# Patient Record
Sex: Female | Born: 1975 | Race: Black or African American | Hispanic: No | Marital: Single | Smoking: Never smoker
Health system: Southern US, Community
[De-identification: ages and names within clinical notes are randomized; demographics above are authoritative.]

## PROBLEM LIST (undated history)

## (undated) DIAGNOSIS — E282 Polycystic ovarian syndrome: Secondary | ICD-10-CM

## (undated) DIAGNOSIS — K219 Gastro-esophageal reflux disease without esophagitis: Secondary | ICD-10-CM

## (undated) DIAGNOSIS — M797 Fibromyalgia: Secondary | ICD-10-CM

## (undated) DIAGNOSIS — E669 Obesity, unspecified: Secondary | ICD-10-CM

## (undated) DIAGNOSIS — S060X9A Concussion with loss of consciousness of unspecified duration, initial encounter: Secondary | ICD-10-CM

## (undated) DIAGNOSIS — S060XAA Concussion with loss of consciousness status unknown, initial encounter: Secondary | ICD-10-CM

## (undated) DIAGNOSIS — I10 Essential (primary) hypertension: Secondary | ICD-10-CM

## (undated) DIAGNOSIS — I272 Pulmonary hypertension, unspecified: Secondary | ICD-10-CM

## (undated) DIAGNOSIS — E785 Hyperlipidemia, unspecified: Secondary | ICD-10-CM

## (undated) DIAGNOSIS — T7840XA Allergy, unspecified, initial encounter: Secondary | ICD-10-CM

## (undated) DIAGNOSIS — S0990XA Unspecified injury of head, initial encounter: Secondary | ICD-10-CM

## (undated) DIAGNOSIS — M1990 Unspecified osteoarthritis, unspecified site: Secondary | ICD-10-CM

## (undated) DIAGNOSIS — M069 Rheumatoid arthritis, unspecified: Secondary | ICD-10-CM

## (undated) HISTORY — DX: Essential (primary) hypertension: I10

## (undated) HISTORY — DX: Rheumatoid arthritis, unspecified: M06.9

## (undated) HISTORY — DX: Pulmonary hypertension, unspecified: I27.20

## (undated) HISTORY — DX: Gastro-esophageal reflux disease without esophagitis: K21.9

## (undated) HISTORY — DX: Unspecified osteoarthritis, unspecified site: M19.90

## (undated) HISTORY — DX: Allergy, unspecified, initial encounter: T78.40XA

## (undated) HISTORY — DX: Hyperlipidemia, unspecified: E78.5

## (undated) HISTORY — PX: NO PAST SURGERIES: SHX2092

---

## 2000-10-18 DIAGNOSIS — S0990XA Unspecified injury of head, initial encounter: Secondary | ICD-10-CM

## 2000-10-18 HISTORY — DX: Unspecified injury of head, initial encounter: S09.90XA

## 2010-09-17 ENCOUNTER — Ambulatory Visit: Payer: Self-pay | Admitting: Family Medicine

## 2010-09-17 DIAGNOSIS — E282 Polycystic ovarian syndrome: Secondary | ICD-10-CM

## 2010-09-17 DIAGNOSIS — G4730 Sleep apnea, unspecified: Secondary | ICD-10-CM | POA: Insufficient documentation

## 2010-09-17 DIAGNOSIS — R21 Rash and other nonspecific skin eruption: Secondary | ICD-10-CM | POA: Insufficient documentation

## 2010-09-17 DIAGNOSIS — J45909 Unspecified asthma, uncomplicated: Secondary | ICD-10-CM | POA: Insufficient documentation

## 2010-09-17 DIAGNOSIS — S0990XA Unspecified injury of head, initial encounter: Secondary | ICD-10-CM | POA: Insufficient documentation

## 2010-09-17 DIAGNOSIS — E119 Type 2 diabetes mellitus without complications: Secondary | ICD-10-CM | POA: Insufficient documentation

## 2010-09-17 DIAGNOSIS — I10 Essential (primary) hypertension: Secondary | ICD-10-CM

## 2010-09-17 DIAGNOSIS — K219 Gastro-esophageal reflux disease without esophagitis: Secondary | ICD-10-CM

## 2010-09-17 DIAGNOSIS — M109 Gout, unspecified: Secondary | ICD-10-CM | POA: Insufficient documentation

## 2010-09-17 DIAGNOSIS — J309 Allergic rhinitis, unspecified: Secondary | ICD-10-CM | POA: Insufficient documentation

## 2010-09-17 DIAGNOSIS — E785 Hyperlipidemia, unspecified: Secondary | ICD-10-CM

## 2010-09-17 DIAGNOSIS — M129 Arthropathy, unspecified: Secondary | ICD-10-CM | POA: Insufficient documentation

## 2010-09-17 DIAGNOSIS — I2789 Other specified pulmonary heart diseases: Secondary | ICD-10-CM

## 2010-09-21 ENCOUNTER — Telehealth (INDEPENDENT_AMBULATORY_CARE_PROVIDER_SITE_OTHER): Payer: Self-pay | Admitting: *Deleted

## 2010-09-21 LAB — CONVERTED CEMR LAB
ALT: 19 units/L (ref 0–35)
Albumin: 3.7 g/dL (ref 3.5–5.2)
Basophils Relative: 0.4 % (ref 0.0–3.0)
Cholesterol: 231 mg/dL — ABNORMAL HIGH (ref 0–200)
Direct LDL: 164.9 mg/dL
Eosinophils Relative: 0.5 % (ref 0.0–5.0)
GFR calc non Af Amer: 109.98 mL/min (ref >60)
HDL: 52 mg/dL (ref >39.00)
Hgb A1c MFr Bld: 6.3 % (ref 4.6–6.5)
Lymphocytes Relative: 28.7 % (ref 12.0–46.0)
Neutrophils Relative %: 66.5 % (ref 43.0–77.0)
Platelets: 270 10*3/uL (ref 150.0–400.0)
Potassium: 3.9 meq/L (ref 3.5–5.1)
RBC: 4.12 M/uL (ref 3.87–5.11)
Sodium: 136 meq/L (ref 135–145)
TSH: 1.52 microintl units/mL (ref 0.35–5.50)
Total Bilirubin: 0.5 mg/dL (ref 0.3–1.2)
Total Protein: 7 g/dL (ref 6.0–8.3)
Triglycerides: 65 mg/dL (ref 0.0–149.0)
WBC: 15.8 10*3/uL — ABNORMAL HIGH (ref 4.5–10.5)

## 2010-09-23 ENCOUNTER — Telehealth (INDEPENDENT_AMBULATORY_CARE_PROVIDER_SITE_OTHER): Payer: Self-pay | Admitting: *Deleted

## 2010-09-23 ENCOUNTER — Ambulatory Visit: Payer: Self-pay | Admitting: Family Medicine

## 2010-09-23 DIAGNOSIS — D72829 Elevated white blood cell count, unspecified: Secondary | ICD-10-CM | POA: Insufficient documentation

## 2010-09-23 LAB — CONVERTED CEMR LAB
Basophils Relative: 0.4 % (ref 0.0–3.0)
Eosinophils Relative: 0.5 % (ref 0.0–5.0)
HCT: 37.1 % (ref 36.0–46.0)
MCV: 90.2 fL (ref 78.0–100.0)
Monocytes Absolute: 0.7 10*3/uL (ref 0.1–1.0)
Monocytes Relative: 4.5 % (ref 3.0–12.0)
Neutrophils Relative %: 59.1 % (ref 43.0–77.0)
RBC: 4.11 M/uL (ref 3.87–5.11)
WBC: 16.7 10*3/uL — ABNORMAL HIGH (ref 4.5–10.5)

## 2010-09-25 ENCOUNTER — Ambulatory Visit: Payer: Self-pay | Admitting: Hematology & Oncology

## 2010-09-30 ENCOUNTER — Telehealth: Payer: Self-pay | Admitting: Family Medicine

## 2010-10-09 ENCOUNTER — Encounter: Payer: Self-pay | Admitting: Family Medicine

## 2010-10-09 LAB — CBC WITH DIFFERENTIAL (CANCER CENTER ONLY)
BASO#: 0.1 10*3/uL (ref 0.0–0.2)
Eosinophils Absolute: 0.2 10*3/uL (ref 0.0–0.5)
HCT: 37 % (ref 34.8–46.6)
LYMPH%: 25.2 % (ref 14.0–48.0)
MCH: 29.4 pg (ref 26.0–34.0)
MCV: 87 fL (ref 81–101)
MONO#: 0.5 10*3/uL (ref 0.1–0.9)
MONO%: 4.6 % (ref 0.0–13.0)
NEUT%: 68.2 % (ref 39.6–80.0)
Platelets: 281 10*3/uL (ref 145–400)
RBC: 4.24 10*6/uL (ref 3.70–5.32)
WBC: 11.3 10*3/uL — ABNORMAL HIGH (ref 3.9–10.0)

## 2010-10-09 LAB — CHCC SATELLITE - SMEAR

## 2010-10-27 ENCOUNTER — Encounter: Payer: Self-pay | Admitting: Family Medicine

## 2010-11-10 ENCOUNTER — Ambulatory Visit
Admission: RE | Admit: 2010-11-10 | Discharge: 2010-11-10 | Payer: Self-pay | Source: Home / Self Care | Attending: Pulmonary Disease | Admitting: Pulmonary Disease

## 2010-11-17 ENCOUNTER — Telehealth: Payer: Self-pay | Admitting: Pulmonary Disease

## 2010-11-17 NOTE — Progress Notes (Signed)
Summary: labs   Phone Note Outgoing Call   Call placed by: Doristine Devoid CMA,  September 21, 2010 4:59 PM Call placed to: Patient Summary of Call: total cholesterol and LDL are both elevated.  given that pt has DM her goal is <70 and only Crestor will get her the reduction she needs.  should start Crestor 40 mg at bedtime and recheck LFTs in 6-8 weeks.  A1C of 6.3 also confirms diabetes.  should restart Metformin 500mg  two times a day.  this will also help w/ PCOS  WBC elevated.  should recheck CBC in 1 week.  dx leukocytosis   Follow-up for Phone Call        left message on machine .....Marland KitchenMarland KitchenDoristine Devoid CMA  September 21, 2010 4:59 PM   Additional Follow-up for Phone Call Additional follow up Details #1::        Patient is aware, rx's need to be sent to [facility] [address] in [facility]. Lab appt made for tomorrow.  Additional Follow-up by: Harold Barban,  September 22, 2010 10:26 AM    New/Updated Medications: METFORMIN HCL 500 MG TABS (METFORMIN HCL) take one tablet two times a day CRESTOR 40 MG TABS (ROSUVASTATIN CALCIUM) take one tablet at bedtime Prescriptions: CRESTOR 40 MG TABS (ROSUVASTATIN CALCIUM) take one tablet at bedtime  #30 x 3   Entered by:   Doristine Devoid CMA   Authorized by:   Neena Rhymes MD   Signed by:   Doristine Devoid CMA on 09/22/2010   Method used:   Electronically to        [facility] # [phone]* (retail)       [address] [place], [place]  [address]       Ph: [phone]       Fax: [phone]   RxID:   [id number] METFORMIN HCL 500 MG TABS (METFORMIN HCL) take one tablet two times a day  #60 x 3   Entered by:   Doristine Devoid CMA   Authorized by:   Neena Rhymes MD   Signed by:   Doristine Devoid CMA on 09/22/2010   Method used:   Electronically to        [facility] # [phone]* (retail)       [address] [place], [place]  [address]       Ph: [phone]       Fax:  [phone]   RxID:   [id number]

## 2010-11-17 NOTE — Progress Notes (Signed)
Summary: labs-  Phone Note Outgoing Call   Call placed by: Doristine Devoid CMA,  September 23, 2010 4:51 PM Call placed to: Patient Summary of Call: WBC is higher than last time.  if pt is asymptomatic (no complaints of infxn) she needs a referral to hematology to determine the cause of her increased numbers   Follow-up for Phone Call        Spoke w/ patient says she has had problem w/ WBCs previous and saw hematologist about 2 years ago but would like to go ahead w/ referral. Also says she feels alittle ill but nothing that would indicated infection informed we will contact her with appt information........Marland KitchenDoristine Devoid CMA  September 23, 2010 4:52 PM   New Problems: LEUKOCYTOSIS UNSPECIFIED (ICD-288.60)   New Problems: LEUKOCYTOSIS UNSPECIFIED (ICD-288.60)

## 2010-11-19 NOTE — Consult Note (Signed)
Summary: [facility]  [facility]   Imported By: Lanelle Bal 11/12/2010 12:54:02  _____________________________________________________________________  External Attachment:    Type:   Image     Comment:   External Document

## 2010-11-19 NOTE — Consult Note (Signed)
Summary: [facility]  [facility]   Imported By: Lanelle Bal 11/13/2010 09:38:52  _____________________________________________________________________  External Attachment:    Type:   Image     Comment:   External Document

## 2010-11-19 NOTE — Progress Notes (Signed)
Summary: REFERRALS FOR PAIN  Phone Note Outgoing Call Call back at Home Phone [phone]   Call placed by: Magdalen Spatz [facility],  September 30, 2010 1:52 PM Call placed to: Patient Summary of Call: I CONTACTED PATIENT TO INFORM HER OF ALL HER REFERRAL APPTS.  PATIENT STATES SHE DIDN'T NEED THE ORTHOPAEDIC REFERRAL, SHE IS ALREADY SEEING [facility] HER ER VISIT & THEY DID NOTHING FOR HER PAIN.  PATIENT THOUGHT DR. TABORI WAS GOING TO REFER HER TO PHYSICAL THERAPY OR PAIN MGMT, OR BOTH??  PLEASE ADVISE. Initial call taken by: Magdalen Spatz [facility]  September 30, 2010 1:52 PM  Follow-up for Phone Call        she can start w/ PT and if no improvement in pain will refer to pain management.  was unaware that she was seeing ortho but have it written on my notes from her 1st visit that the referral was to ortho.  my mistake. Follow-up by: Neena Rhymes MD,  September 30, 2010 2:17 PM  Additional Follow-up for Phone Call Additional follow up Details #1::        I left message for patient to call me Magdalen Spatz [facility]  October 02, 2010 10:10 AM  I left another message for patient to call me. Magdalen Spatz [facility]  October 16, 2010 9:24 AM

## 2010-11-19 NOTE — Assessment & Plan Note (Signed)
Summary: NEW TO ESTAB/CBS   Vital Signs:  Patient profile:   35 year old female Height:      70.75 inches Weight:      397 pounds BMI:     55.96 Pulse rate:   80 / minute BP sitting:   130 / 78  (left arm)  Vitals Entered By: Doristine Devoid CMA (September 17, 2010 10:09 AM) CC: NEW EST- NEEDS LABS AND DISCUSS MEDS    History of Present Illness: 35 yo woman here today to establish care.  recently moved from [place], [place].  Previous MD- Abbass.  1) Diabetes- reports she has been 'borderline' for a long time.  trying to control this w/ diet and exercise.  was given script for Glucophage but did not start this.  was having eye exams regularly, needs referral.  2) s/p head injury- 2002, has chronic pain in head, back, neck.  has trigeminal neuralgia.  was seeing a pain specialist in [place]- but hasn't seen since 2008.  went to ER last week after re-injuring head and was told she had a neck fx.  was referred to Dr Kendra Opitz (neurosurg), 12/7.  is wearing neck brace.  was started on Prednisone, Vicodin, Flexeril.  3) HTN- again not on meds.  tries to control w/ limited salt.  previously on Norvasc, HCTZ (flared gout).  4) Pulmonary HTN- dx'd in 2005, was previously seeing Cards and pulm.  on recheck pt reports 'pressures in the heart were normal'.  5) Gout- previously took Allopurinol w/ good results.  6) sleep apnea- not wearing CPAP mask.  needs pulm referral  7) GERD- previously on Prilosec and/or Nexium.  having sxs now that she's not on meds.  8) hyperlipidemia- doctor attempted to start meds and pt had difficulty w/ side effects- abd pain, nausea, dizziness.  trying to manage w/ diet and exercise.  9) arthritis- has R knee pain, back pain, DDD in neck/back.  10) PCOS- mentions this in same sentence as primary amenorrhea.  will need to establish w/ GYN  11) rash- reports she will have areas on skin that will periodically flare and then improve.  wants to see derm.  Preventive  Screening-Counseling & Management  Alcohol-Tobacco     Smoking Status: never  Caffeine-Diet-Exercise     Does Patient Exercise: no  Current Medications (verified): 1)  Vicodin .... Dose and Frequency Unknown 2)  Flexeril .... Dose and Frequency Unknown 3)  Prednisone .... Dose and Frequency Unknown  Allergies (verified): No Known Drug Allergies  Past History:  Past Medical History: Asthma Arthritis Diabetes mellitus, type II GERD Gout Pulmonary Hypertension  Neck Fracture Allergic rhinitis  Past Surgical History: none  Family History: CAD-no HTN-mother DM-grandmother,brother STROKE-no COLON CA-no BREAST CA-no  Social History: disabled due to head injurySmoking Status:  never Does Patient Exercise:  no  Review of Systems      See HPI  Physical Exam  General:  morbidly obese Head:  NCAT Eyes:  PERRL, EOMI Neck:  No deformities, masses, or tenderness noted. Lungs:  Normal respiratory effort, chest expands symmetrically. Lungs are clear to auscultation, no crackles or wheezes. Heart:  reg S1/S2 Abdomen:  obese, soft, NT/ND, +BS Pulses:  + 1 DP/PT Extremities:  difficult to distinguish edema from large legs Neurologic:  alert & oriented X3 and cranial nerves II-XII intact.  walks w/ cane  Skin:  turgor normal and color normal.   Cervical Nodes:  No lymphadenopathy noted Psych:  Oriented X3, normally interactive, and good eye  contact.     Impression & Recommendations:  Problem # 1:  DIABETES MELLITUS, TYPE II (ICD-250.00) Assessment New not currently on meds.  unclear why she never started metformin which would also help PCOS.  check labs and adjust meds as needed.  will refer for eye exam Orders: Specimen Handling ([address]109) TLB-BMP (Basic Metabolic Panel-BMET) (80048-METABOL) TLB-TSH (Thyroid Stimulating Hormone) (84443-TSH) TLB-A1C / Hgb A1C (Glycohemoglobin) (83036-A1C) Ophthalmology Referral (Ophthalmology)  Her updated medication list for this  problem includes:    Metformin Hcl 500 Mg Tabs (Metformin hcl) .Marland Kitchen... Take one tablet two times a day  Problem # 2:  HEAD INJURY, UNSPECIFIED (ICD-959.01) Assessment: New unclear as to what type of injury pt had or residual effects.  apparently this was enough to qualify her for disability.  takes pain meds regularly.  Problem # 3:  ESSENTIAL HYPERTENSION, BENIGN (ICD-401.1) Assessment: New BP adequately controlled today.  will need close monitoring.  discussed weight loss, healthy diet and regular exercise- all will improve pt's overall health and BP. Orders: Specimen Handling ([address]) TLB-CBC Platelet - w/Differential (85025-CBCD)  Problem # 4:  PULMONARY HYPERTENSION (ICD-416.8) Assessment: New  per pt report.  not currently on any meds.  will refer to pulm.  Orders: Pulmonary Referral (Pulmonary)  Problem # 5:  GOUT (ICD-274.9) Assessment: New pt w/ hx of gout.  previously on allopurinol daily.  will restart this to prevent flares- pt asymptomatic today. Her updated medication list for this problem includes:    Allopurinol 100 Mg Tabs (Allopurinol) .Marland Kitchen... 1 by mouth daily  Problem # 6:  SLEEP APNEA (ICD-780.57) Assessment: New  not wearing CPAP like she is supposed to.  will refer to pulm.  Orders: Pulmonary Referral (Pulmonary)  Problem # 7:  GERD (ICD-530.81) Assessment: New having sxs now that she's not on meds.  start omeprazole as it is generic.  reviewed dietary and lifestyle modifications. Her updated medication list for this problem includes:    Omeprazole 20 Mg Cpdr (Omeprazole) ..... One by mouth daily  Problem # 8:  HYPERLIPIDEMIA (ICD-272.4) Assessment: New not on meds despite dx.  check labs and start meds as needed. Orders: Venipuncture (09811) Specimen Handling (91478) TLB-Lipid Panel (80061-LIPID) TLB-Hepatic/Liver Function Pnl (80076-HEPATIC)  Her updated medication list for this problem includes:    Crestor 40 Mg Tabs (Rosuvastatin calcium) .Marland Kitchen...  Take one tablet at bedtime  Problem # 9:  ARTHRITIS (ICD-716.90) Assessment: New  this is very limiting for pt.  biggest complaint.  refer to ortho.  Orders: Orthopedic Referral (Ortho)  Problem # 10:  POLYCYSTIC OVARIES (ICD-256.4) Assessment: New  eventually refer to GYN for management.  Orders: Gynecologic Referral (Gyn)  Problem # 11:  RASH-NONVESICULAR (ICD-782.1) Assessment: New  start steroid cream as needed for flares.  refer to derm. Her updated medication list for this problem includes:    Hydrocortisone 2.5 % Oint (Hydrocortisone) .Marland Kitchen... Apply to affected area two times a day as needed.  disp 1 large tube  Orders: Dermatology Referral [facility]) Prescription Created Electronically ([id number])  Complete Medication List: 1)  Vicodin  .... Dose and frequency unknown 2)  Flexeril  .... Dose and frequency unknown 3)  Prednisone  .... Dose and frequency unknown 4)  Allopurinol 100 Mg Tabs (Allopurinol) .Marland Kitchen.. 1 by mouth daily 5)  Omeprazole 20 Mg Cpdr (Omeprazole) .... One by mouth daily 6)  Hydrocortisone 2.5 % Oint (Hydrocortisone) .... Apply to affected area two times a day as needed.  disp 1 large tube 7)  Single [employer]  .Marland KitchenMarland KitchenMarland Kitchen  1 heavy duty single point cane to assist w/ ambulation.  dx- arthritis 8)  Metformin Hcl 500 Mg Tabs (Metformin hcl) .... Take one tablet two times a day 9)  Crestor 40 Mg Tabs (Rosuvastatin calcium) .... Take one tablet at bedtime  Patient Instructions: 1)  Once we review your labs we'll determine when you need to follow up 2)  Someone will call you with your Pulmonary and Ortho appts.  We'll eventually get you to GYN and Derm as well. 3)  Start the Allopurinol daily for gout 4)  Take the Omeprazole daily for reflux 5)  Use the Hydrocortisone on your L forearm 6)  Please have your neurosurgeon send me his report 7)  Call with any questions or concerns 8)  Welcome!  We're glad to have you! Prescriptions: SINGLE POINT CANE 1 heavy duty single  point cane to assist w/ ambulation.  dx- arthritis  #1 x 0   Entered and Authorized by:   Neena Rhymes MD   Signed by:   Neena Rhymes MD on 09/17/2010   Method used:   Print then Give to Patient   RxID:   [id number] HYDROCORTISONE 2.5 % OINT (HYDROCORTISONE) apply to affected area two times a day as needed.  disp 1 large tube  #1 tube x 3   Entered and Authorized by:   Neena Rhymes MD   Signed by:   Neena Rhymes MD on 09/17/2010   Method used:   Electronically to        [facility] # [phone]* (retail)       [address] [place], [place]  [address]       Ph: [phone]       Fax: [phone]   RxID:   [id number] OMEPRAZOLE 20 MG CPDR (OMEPRAZOLE) one by mouth daily  #30 x 3   Entered and Authorized by:   Neena Rhymes MD   Signed by:   Neena Rhymes MD on 09/17/2010   Method used:   Electronically to        [facility] # [id number]* (retail)       [address] [place], [place]  [address]       Ph: [phone]       Fax: [phone]   RxID:   [id number] ALLOPURINOL 100 MG TABS (ALLOPURINOL) 1 by mouth daily  #30 x 3   Entered and Authorized by:   Neena Rhymes MD   Signed by:   Neena Rhymes MD on 09/17/2010   Method used:   Electronically to        [facility] # [phone]* (retail)       [address] [place], [place]  [address]       Ph: [phone]       Fax: [phone]   RxID:   [id number]    Orders Added: 1)  Venipuncture [18299] 2)  Specimen Handling [99000] 3)  TLB-Lipid Panel [80061-LIPID] 4)  TLB-Hepatic/Liver Function Pnl [80076-HEPATIC] 5)  TLB-BMP (Basic Metabolic Panel-BMET) [80048-METABOL] 6)  TLB-TSH (Thyroid Stimulating Hormone) [84443-TSH] 7)  TLB-A1C / Hgb A1C (Glycohemoglobin) [83036-A1C] 8)  TLB-CBC Platelet - w/Differential [85025-CBCD] 9)  Pulmonary Referral [Pulmonary] 10)  Orthopedic Referral  [Ortho] 11)  Gynecologic Referral [Gyn] 12)  Dermatology Referral [Derma] 13)  Ophthalmology Referral [Ophthalmology] 14)  New Patient Level IV [99204] 15)  Prescription Created Electronically [phone]

## 2010-11-19 NOTE — Assessment & Plan Note (Signed)
Summary: SLEEP APNEA- [facility] OFFICE //[facility]   Visit Type:  Initial Consult Copy to:  pcp Primary Provider/Referring Provider:  Neena Rhymes MD  CC:  Sleep consult.  History of Present Illness: 35/F , morbidly obese for evaluation of obstructive sleep apnea . She was diagnosed a few years ago in [place] with PAH & OSA & placed on CPAP whith which she was sporadically compliant. She moved to [place] after the death of her husband & 'lost ' her cpap during the move. Epworth Sleepiness Score is 10/24, she reports loud snoring & witnessed apneas. She weighs 407 lbs now, she had lost about 60 lbs but has regained most of this weight over the lsat 2 years. bedtime is 10p, sleep latency is minimal, she has 4- awakenings, she wakes up for prayer between 2-4 am, oob at 0900, feeling unrefreshed & tired, dryness +, no headaches. There is no history suggestive of cataplexy, sleep paralysis or parasomnias   Preventive Screening-Counseling & Management  Alcohol-Tobacco     Smoking Status: never   History of Present Illness: Pulmonary Hypertension, sleep apnea  What time do you typically go to bed?(between what hours): 10pm  How long does it take you to fall asleep? 5-10 min  How many times during the night do you wake up? 4-5  What time do you get out of bed to start your day? 8-9  Do you drive or operate heavy machinery in your occupation? no  How much has your weight changed (up or down) over the past two years? (in pounds): 60 lbs  Have you ever had a sleep study before?  If yes,when and where: yes, [facility], [place] [facility])  Do you currently use CPAP ? If so , at what pressure? no  Do you wear oxygen at any time? If yes, how many liters per minute? no Current Medications (verified): 1)  Hydrocodone-Acetaminophen 7.5-500 Mg Tabs (Hydrocodone-Acetaminophen) .... As Needed 2)  Allopurinol 100 Mg Tabs (Allopurinol) .Marland Kitchen.. 1 By Mouth Daily As Needed For  Gout Flares 3)  Omeprazole 20 Mg Cpdr (Omeprazole) .... One By Mouth Daily As Needed 4)  Hydrocortisone 2.5 % Oint (Hydrocortisone) .... Apply To Affected Area Two Times A Day As Needed.  Disp 1 Large Tube 5)  Single Point Cane .Marland Kitchen.. 1 Heavy Duty Single [employer] To Assist W/ Ambulation.  Dx- Arthritis 6)  Metformin Hcl 500 Mg Tabs (Metformin Hcl) .... Take One Tablet Two Times A Day 7)  Crestor 40 Mg Tabs (Rosuvastatin Calcium) .... Take One Tablet At Bedtime 8)  Motrin Ib 200 Mg Tabs (Ibuprofen) .... As Needed For Pain  Allergies (verified): 1)  ! Naproxen (Naproxen)  Past History:  Past Medical History: Last updated: 09/17/2010 Asthma Arthritis Diabetes mellitus, type II GERD Gout Pulmonary Hypertension  Neck Fracture Allergic rhinitis  Family History: Last updated: 09/17/2010 CAD-no HTN-mother DM-grandmother,brother STROKE-no COLON CA-no BREAST CA-no  Social History: Last updated: 09/17/2010 disabled due to head injury  Review of Systems       The patient complains of shortness of breath with activity, itching, and joint stiffness or pain.  The patient denies shortness of breath at rest, productive cough, non-productive cough, coughing up blood, chest pain, irregular heartbeats, acid heartburn, indigestion, loss of appetite, weight change, abdominal pain, difficulty swallowing, sore throat, tooth/dental problems, headaches, nasal congestion/difficulty breathing through nose, sneezing, ear ache, anxiety, depression, hand/feet swelling, rash, change in color of mucus, and fever.    Vital Signs:  Patient profile:  35 year old female Height:      70.75 inches Weight:      407 pounds O2 Sat:      99 % on Room air Temp:     98.2 degrees F oral Pulse rate:   75 / minute BP sitting:   110 / 80  (left arm) Cuff size:   large  Vitals Entered By: Zackery Barefoot CMA (November 10, 2010 5:14 PM)  O2 Flow:  Room air CC: Sleep consult Comments Medications reviewed with  patient Verified contact number and pharmacy with patient Zackery Barefoot CMA  November 10, 2010 5:14 PM    Physical Exam  Additional Exam:  wt 407 November 11, 2010  [place],obese , in no distress, normal affect ENT - no lesions, no post nasal drip, class 3 airway, enlarged tonsils Neck: No JVD, no thyromegaly, no carotid bruits Lungs: no use of accessory muscles, no dullness to percussion, clear without rales or rhonchi  Cardiovascular: Rhythm regular, heart sounds  normal, no murmurs or gallops, no peripheral edema Abdomen: soft and non-tender, no hepatosplenomegaly, BS normal. Musculoskeletal: No deformities, no cyanosis or clubbing Neuro:  alert, non focal     Impression & Recommendations:  Problem # 1:  PULMONARY HYPERTENSION (ICD-416.8)  Obtain records from [facility]  Orders: Consultation Level III [phone])  Problem # 2:  SLEEP APNEA (ICD-780.57) The pathophysiology of obstructive sleep apnea, it's cardiovascular consequences and modes of treatment including CPAP were discussed with the patient in great detail.  Split study will be scheduled, she will be restarted on CPAP Compliance encouraged, wt loss emphasized, asked to avoid meds with sedative side effects, cautioned against driving when sleepy.  Orders: Sleep Study (Sleep Study) Consultation Level III [phone])  Medications Added to Medication List This Visit: 1)  Hydrocodone-acetaminophen 7.5-500 Mg Tabs (Hydrocodone-acetaminophen) .... As needed 2)  Allopurinol 100 Mg Tabs (Allopurinol) .Marland Kitchen.. 1 by mouth daily as needed for gout flares 3)  Omeprazole 20 Mg Cpdr (Omeprazole) .... One by mouth daily as needed 4)  Motrin Ib 200 Mg Tabs (Ibuprofen) .... As needed for pain  Patient Instructions: 1)  Copy sent to: Dr Beverely Low 2)  Please schedule a follow-up appointment in 2 weeks after sleep study   Not Administered:    Influenza Vaccine not given due to: declined

## 2010-11-20 ENCOUNTER — Encounter: Payer: Self-pay | Admitting: Pulmonary Disease

## 2010-11-25 NOTE — Progress Notes (Signed)
Summary: Needs f/u scheduled  ---- Converted from flag ---- ---- 11/10/2010 4:53 PM, Zackery Barefoot CMA wrote: Need to schedule pt follow up in 1 month. Split is 02/22 ------------------------------  Phone Note Outgoing Call Call back at [facility] Phone [phone]   Summary of Call: lmomtcb to schedule appointment with RA in [place] 2/28. Initial call taken by: Zackery Barefoot CMA,  November 17, 2010 11:01 AM  Follow-up for Phone Call        lmomtcb x 2 Zackery Barefoot [facility]  November 17, 2010 5:55 PM   lmomtcb x 3 will send letter. Zackery Barefoot CMA  November 20, 2010 8:51 AM

## 2010-11-25 NOTE — Letter (Signed)
SummaryScience writer Pulmonary Care Appointment Letter  [facility]  [address]   [place], [place] [address]   Phone: [phone]  Fax: [phone]    11/20/2010 MRN: [id number]  [facility] [address] [place], [place]  [address]  Dear Ms. Lafata,   Our office is attempting to contact you about an appointment.  Please call our office at [phone] to schedule this appointment with [facility] in [facility].  Our registration staff is prepared to assist you with any questions you may have.    Thank you,   Nature conservation officer Pulmonary Division

## 2010-12-09 ENCOUNTER — Ambulatory Visit (HOSPITAL_BASED_OUTPATIENT_CLINIC_OR_DEPARTMENT_OTHER): Payer: Self-pay

## 2010-12-11 ENCOUNTER — Encounter: Payer: Self-pay | Admitting: Family Medicine

## 2010-12-11 ENCOUNTER — Inpatient Hospital Stay (HOSPITAL_COMMUNITY): Payer: Medicare Other

## 2010-12-11 ENCOUNTER — Inpatient Hospital Stay (HOSPITAL_COMMUNITY)
Admission: AD | Admit: 2010-12-11 | Discharge: 2010-12-11 | Disposition: A | Discharge status: Home / Self Care | Payer: Medicare Other | Source: Ambulatory Visit | Attending: Obstetrics & Gynecology | Admitting: Obstetrics & Gynecology

## 2010-12-11 DIAGNOSIS — N949 Unspecified condition associated with female genital organs and menstrual cycle: Secondary | ICD-10-CM

## 2010-12-11 DIAGNOSIS — R102 Pelvic and perineal pain: Secondary | ICD-10-CM

## 2010-12-11 LAB — WET PREP, GENITAL: Trich, Wet Prep: NONE SEEN

## 2010-12-11 LAB — HM DIABETES EYE EXAM: HM Diabetic Eye Exam: NORMAL

## 2010-12-11 LAB — URINALYSIS, ROUTINE W REFLEX MICROSCOPIC
Leukocytes, UA: NEGATIVE
Specific Gravity, Urine: 1.025 (ref 1.005–1.030)
Urine Glucose, Fasting: NEGATIVE mg/dL
pH: 6 (ref 5.0–8.0)

## 2010-12-14 ENCOUNTER — Encounter (INDEPENDENT_AMBULATORY_CARE_PROVIDER_SITE_OTHER): Payer: Self-pay | Admitting: *Deleted

## 2010-12-14 LAB — GC/CHLAMYDIA PROBE AMP, GENITAL
Chlamydia, DNA Probe: POSITIVE — AB
GC Probe Amp, Genital: NEGATIVE

## 2010-12-15 ENCOUNTER — Ambulatory Visit: Payer: Self-pay | Admitting: Pulmonary Disease

## 2010-12-24 NOTE — Letter (Signed)
Summary: Eye Exam/[facility]  Eye Exam/[facility]   Imported By: Maryln Gottron 12/17/2010 12:13:27  _____________________________________________________________________  External Attachment:    Type:   Image     Comment:   External Document

## 2010-12-24 NOTE — Miscellaneous (Signed)
  Clinical Lists Changes  Observations: Added new observation of DMEYEEXAMNXT: 12/2011 (12/14/2010 10:53) Added new observation of DMEYEEXMRES: normal (12/11/2010 10:54) Added new observation of EYE EXAM BY: Dr. Pearlean Brownie (12/11/2010 10:54) Added new observation of DIAB EYE EX: normal (12/11/2010 10:54)         Diabetes Management Exam:    Eye Exam:       Eye Exam done elsewhere          Date: 12/11/2010          Results: normal          Done by: Dr. Pearlean Brownie

## 2010-12-30 ENCOUNTER — Encounter: Payer: Self-pay | Admitting: Family Medicine

## 2010-12-30 ENCOUNTER — Ambulatory Visit (INDEPENDENT_AMBULATORY_CARE_PROVIDER_SITE_OTHER): Payer: Medicare Other | Admitting: Family Medicine

## 2010-12-30 DIAGNOSIS — M549 Dorsalgia, unspecified: Secondary | ICD-10-CM

## 2010-12-30 DIAGNOSIS — J45909 Unspecified asthma, uncomplicated: Secondary | ICD-10-CM

## 2011-01-05 NOTE — Assessment & Plan Note (Signed)
Summary: congested/cbs   Vital Signs:  Patient profile:   35 year old female Height:      70.75 inches (179.71 cm) Weight:      410.13 pounds (186.42 kg) BMI:     57.81 Temp:     97.0 degrees F (36.11 degrees C) oral BP sitting:   120 / 78  (left arm) Cuff size:   large  Vitals Entered By: Lucious Groves CMA (December 30, 2010 11:18 AM) CC: C/O upper respiratory and pain issues./kb Is Patient Diabetic? Yes Pain Assessment Patient in pain? yes     Location: back Intensity: 9 Type: sharp Onset of pain  continuous Comments Patient notes that she has been having fever, HA, cough with yellow mucous production, and sore throat. Patient states that she thinks a cold has turned into bronchitis. She also notes that she is having trouble with arthritis/sciatica.   History of Present Illness: 35 yo woman here today for URI.  sxs started w/ sore throat and productive cough.  + PND.  was dx'd w/ sinus infxn and started on Amox 875mg  two times a day x10 days- finished yesterday.  reports sinus sxs have improved.  at night is having increased difficulty breathing- 'hacking up a mess of stuff'.  no fevers.  does not have current inhaler but has used previously.  L sided back pain- saw ortho.  would like to proceed w/ pain management.  has been to ER for pain.  has not required pain meds in  ~2 yrs.  now having daily pain again.  ibuprofen w/out relief.  has taken Vicodin and Soma in the past w/ fair relief.  pt would like 2nd opinion from ortho.  Current Medications (verified): 1)  Hydrocodone-Acetaminophen 7.5-500 Mg Tabs (Hydrocodone-Acetaminophen) .... As Needed 2)  Allopurinol 100 Mg Tabs (Allopurinol) .Marland Kitchen.. 1 By Mouth Daily As Needed For Gout Flares 3)  Omeprazole 20 Mg Cpdr (Omeprazole) .... One By Mouth Daily As Needed 4)  Hydrocortisone 2.5 % Oint (Hydrocortisone) .... Apply To Affected Area Two Times A Day As Needed.  Disp 1 Large Tube 5)  Single Point Cane .Marland Kitchen.. 1 Heavy Duty Single [employer]  To Assist W/ Ambulation.  Dx- Arthritis 6)  Metformin Hcl 500 Mg Tabs (Metformin Hcl) .... Take One Tablet Two Times A Day 7)  Crestor 40 Mg Tabs (Rosuvastatin Calcium) .... Take One Tablet At Bedtime 8)  Motrin Ib 200 Mg Tabs (Ibuprofen) .... As Needed For Pain 9)  Ventolin Hfa 108 (90 Base) Mcg/act Aers (Albuterol Sulfate) .... 2 Puffs Q4 As Needed For Cough or Wheezing 10)  Cheratussin Ac 100-10 Mg/11ml Syrp (Guaifenesin-Codeine) .Marland Kitchen.. 1-2 Tsps Q4-6 As Needed For Cough 11)  Soma 350 Mg Tabs (Carisoprodol) .Marland Kitchen.. 1 Tab By Mouth Three Times A Day As Needed For Pain  Allergies (verified): 1)  ! Naproxen (Naproxen)  Past History:  Past Medical History: Last updated: 09/17/2010 Asthma Arthritis Diabetes mellitus, type II GERD Gout Pulmonary Hypertension  Neck Fracture Allergic rhinitis  Review of Systems      See HPI  Physical Exam  General:  morbidly obese Head:  NCAT, no TTP over sinuses Eyes:  no injxn or inflammation Ears:  External ear exam shows no significant lesions or deformities.  Otoscopic examination reveals clear canals, tympanic membranes are intact bilaterally without bulging, retraction, inflammation or discharge. Hearing is grossly normal bilaterally. Nose:  External nasal examination shows no deformity or inflammation. Nasal mucosa are pink and moist without lesions or exudates. Mouth:  Oral  mucosa and oropharynx without lesions or exudates.  Teeth in good repair. Neck:  No deformities, masses, or tenderness noted. Lungs:  Normal respiratory effort, chest expands symmetrically. Lungs are clear to auscultation, no crackles or wheezes.  dry cough Heart:  reg S1/S2   Impression & Recommendations:  Problem # 1:  ASTHMA (ICD-493.90) Assessment Deteriorated  pt's nocturnal sxs are most likely asthma relatd and precipitated by recent infxn.  start Albuterol inhaler as needed, pulmocort sample provided as controller med.  will follow. Her updated medication list for  this problem includes:    Ventolin Hfa 108 (90 Base) Mcg/act Aers (Albuterol sulfate) .Marland Kitchen... 2 puffs q4 as needed for cough or wheezing  Orders: Prescription Created Electronically [phone])  Problem # 2:  BACK PAIN (ICD-724.5) Assessment: New  chronic problem for pt.  refill pain meds and start muscle relaxer.  will refer to pain management and get 2nd opinion from ortho. Her updated medication list for this problem includes:    Hydrocodone-acetaminophen 7.5-500 Mg Tabs (Hydrocodone-acetaminophen) .Marland Kitchen... As needed    Motrin Ib 200 Mg Tabs (Ibuprofen) .Marland Kitchen... As needed for pain    Soma 350 Mg Tabs (Carisoprodol) .Marland Kitchen... 1 tab by mouth three times a day as needed for pain  Orders: Pain Clinic Referral (Pain) Orthopedic Referral (Ortho)  Complete Medication List: 1)  Hydrocodone-acetaminophen 7.5-500 Mg Tabs (Hydrocodone-acetaminophen) .... As needed 2)  Allopurinol 100 Mg Tabs (Allopurinol) .Marland Kitchen.. 1 by mouth daily as needed for gout flares 3)  Omeprazole 20 Mg Cpdr (Omeprazole) .... One by mouth daily as needed 4)  Hydrocortisone 2.5 % Oint (Hydrocortisone) .... Apply to affected area two times a day as needed.  disp 1 large tube 5)  Single [employer]  .Marland Kitchen.. 1 heavy duty single point cane to assist w/ ambulation.  dx- arthritis 6)  Metformin Hcl 500 Mg Tabs (Metformin hcl) .... Take one tablet two times a day 7)  Crestor 40 Mg Tabs (Rosuvastatin calcium) .... Take one tablet at bedtime 8)  Motrin Ib 200 Mg Tabs (Ibuprofen) .... As needed for pain 9)  Ventolin Hfa 108 (90 Base) Mcg/act Aers (Albuterol sulfate) .... 2 puffs q4 as needed for cough or wheezing 10)  Cheratussin Ac 100-10 Mg/14ml Syrp (Guaifenesin-codeine) .Marland Kitchen.. 1-2 tsps q4-6 as needed for cough 11)  Soma 350 Mg Tabs (Carisoprodol) .Marland Kitchen.. 1 tab by mouth three times a day as needed for pain  Patient Instructions: 1)  Please schedule a follow up in 1 month to recheck diabetes 2)  Use the albuterol inhaler as needed for cough/wheezing 3)   Use the Pulmicort inhaler- 1 puff two times a day- as your controller medicine 4)  Take the pain meds as needed- we will refer you to pain management 5)  We will send you to another ortho for a 2nd opinion 6)  Use the cough syrup as needed 7)  Call with any questions or concerns 8)  Hang in there! Prescriptions: HYDROCODONE-ACETAMINOPHEN 7.5-500 MG TABS (HYDROCODONE-ACETAMINOPHEN) as needed  #45 x 0   Entered and Authorized by:   Neena Rhymes MD   Signed by:   Neena Rhymes MD on 12/30/2010   Method used:   Print then Give to Patient   RxID:   [id number] SOMA 350 MG TABS (CARISOPRODOL) 1 tab by mouth three times a day as needed for pain  #90 x 1   Entered and Authorized by:   Neena Rhymes MD   Signed by:   Neena Rhymes MD on 12/30/2010   Method  used:   Print then Give to Patient   RxID:   [id number] CHERATUSSIN AC 100-10 MG/5ML SYRP (GUAIFENESIN-CODEINE) 1-2 tsps Q4-6 as needed for cough  #150 x 0   Entered and Authorized by:   Neena Rhymes MD   Signed by:   Neena Rhymes MD on 12/30/2010   Method used:   Print then Give to Patient   RxID:   [id number] VENTOLIN HFA 108 (90 BASE) MCG/ACT AERS (ALBUTEROL SULFATE) 2 puffs Q4 as needed for cough or wheezing  #1 x 3   Entered and Authorized by:   Neena Rhymes MD   Signed by:   Neena Rhymes MD on 12/30/2010   Method used:   Electronically to        [facility] # [phone]* (retail)       [address] [place], [place]  [address]       Ph: [phone]       Fax: [phone]   RxID:   [id number]    Orders Added: 1)  Est. Patient Level III [[address]] 2)  Pain Clinic Referral [Pain] 3)  Orthopedic Referral [Ortho] 4)  Prescription Created Electronically [phone]

## 2011-01-13 ENCOUNTER — Encounter (INDEPENDENT_AMBULATORY_CARE_PROVIDER_SITE_OTHER): Payer: Self-pay | Admitting: *Deleted

## 2011-01-19 NOTE — Letter (Signed)
Summary: Primary Care Consult Scheduled Letter  [facility] [place]  [address] [place], [place] [address]   Phone: [phone]  Fax: [phone]      01/13/2011 MRN: [id number]  [facility] [address] [place], [place]  [address]  [place]    Dear Ms. Bellard,    We have scheduled an appointment for you.  At the recommendation of Dr. Neena Rhymes, we have scheduled you a consult with Marzetta Board, PA-C of [facility] on 01-19-2011 at 1:30pm.  Their address is [address] [place]. The office phone number is [phone] ext [phone].  If this appointment day and time is not convenient for you, please feel free to call the office of the doctor you are being referred to at the number listed above and reschedule the appointment.    It is important for you to keep your scheduled appointments. We are here to make sure you are given good patient care.   Thank you,    Renee, Patient Care Coordinator [facility] [facility]

## 2011-01-22 ENCOUNTER — Encounter: Payer: Self-pay | Admitting: Family Medicine

## 2011-01-26 ENCOUNTER — Ambulatory Visit (HOSPITAL_BASED_OUTPATIENT_CLINIC_OR_DEPARTMENT_OTHER): Payer: Medicare Other

## 2011-01-28 ENCOUNTER — Telehealth: Payer: Self-pay | Admitting: Family Medicine

## 2011-01-28 MED ORDER — HYDROCODONE-ACETAMINOPHEN 7.5-500 MG PO TABS: 1.0000 | ORAL_TABLET | Freq: Four times a day (QID) | ORAL | Status: DC | PRN

## 2011-01-28 NOTE — Telephone Encounter (Signed)
RX faxed to [phone]    KP

## 2011-01-28 NOTE — Telephone Encounter (Signed)
Please advise 

## 2011-01-28 NOTE — Telephone Encounter (Signed)
Patient says she needs one more prescription for Lortab 7.5 because pain management moved her appointment date to 4/19----please call it into  [address], [place] [employer] [place], [place]

## 2011-01-28 NOTE — Telephone Encounter (Signed)
I have that last refill 3/14--- ok to refill x1 if that is correct

## 2011-02-02 ENCOUNTER — Ambulatory Visit: Payer: Medicare Other | Admitting: Family Medicine

## 2011-02-10 ENCOUNTER — Ambulatory Visit (INDEPENDENT_AMBULATORY_CARE_PROVIDER_SITE_OTHER): Payer: Medicare Other | Admitting: Family Medicine

## 2011-02-10 ENCOUNTER — Encounter: Payer: Self-pay | Admitting: Family Medicine

## 2011-02-10 DIAGNOSIS — M129 Arthropathy, unspecified: Secondary | ICD-10-CM

## 2011-02-10 DIAGNOSIS — E119 Type 2 diabetes mellitus without complications: Secondary | ICD-10-CM

## 2011-02-10 DIAGNOSIS — J069 Acute upper respiratory infection, unspecified: Secondary | ICD-10-CM

## 2011-02-10 DIAGNOSIS — E669 Obesity, unspecified: Secondary | ICD-10-CM

## 2011-02-10 DIAGNOSIS — E785 Hyperlipidemia, unspecified: Secondary | ICD-10-CM

## 2011-02-10 LAB — SEDIMENTATION RATE: Sed Rate: 56 mm/hr — ABNORMAL HIGH (ref 0–22)

## 2011-02-10 MED ORDER — METFORMIN HCL ER 750 MG PO TB24: 750.0000 mg | ORAL_TABLET | Freq: Every day | ORAL | Status: DC

## 2011-02-10 NOTE — Patient Instructions (Signed)
Follow up in 3 months to recheck diabetes We will refer you to the [facility] center to help w/ your weight loss Your sore throat appears to be related to your post nasal drip and allergies We'll forward your lab results to your pain doctor Call with any questions or concerns

## 2011-02-10 NOTE — Progress Notes (Signed)
  Subjective:    Patient ID: Kelsey Schmitt, female    DOB: 08-01-1976, 35 y.o.   MRN: [id number]  HPI DM- Taking Metformin XR 750mg  daily.  Admits to not taking it regularly.  Not checking CBGs.  No CP, SOB, edema, visual changes.  Hyperlipidemia- not taking Crestor.  Has gained 20lbs since December.  URI- sore throat, PND, cough- intermittently productive.  Having some dizziness.  Denies fevers/chills.  Has been sleeping w/ a fan on her.   Review of Systems For ROS see HPI     Objective:   Physical Exam  Constitutional: She is oriented to person, place, and time. She appears well-developed and well-nourished. No distress.       Morbidly obese  HENT:  Head: Normocephalic and atraumatic.       TMs normal bilaterally + turbinate edema + PND  Eyes: Conjunctivae and EOM are normal. Pupils are equal, round, and reactive to light.  Neck: Normal range of motion. Neck supple. No thyromegaly present.  Cardiovascular: Normal rate, regular rhythm and intact distal pulses.   No murmur heard.      Distant heart sounds  Pulmonary/Chest: Effort normal. No respiratory distress. She has no wheezes.       Distant breath sounds  Abdominal: Soft. Bowel sounds are normal. She exhibits no distension. There is no tenderness.       obese  Musculoskeletal: She exhibits no edema.  Lymphadenopathy:    She has no cervical adenopathy.  Neurological: She is alert and oriented to person, place, and time.  Skin: Skin is warm and dry.          Assessment & Plan:

## 2011-02-12 ENCOUNTER — Encounter: Payer: Self-pay | Admitting: *Deleted

## 2011-02-21 NOTE — Assessment & Plan Note (Addendum)
Not taking meds as directed, no sense in checking LFTs.  Stressed the importance of reducing cholesterol to reduce the risk of MI and CVA.  Will continue to follow.

## 2011-02-21 NOTE — Assessment & Plan Note (Signed)
Pt continues to gain weight.  Discussed that she is officially diabetic and not 'borderline' as she reports being told.  Reviewed the importance of healthy diet, regular exercise, routine health maintenance including eye exams.  Will follow closely.

## 2011-02-21 NOTE — Assessment & Plan Note (Signed)
Pt appears to have a viral/allergy combo.  OTC antihistamines, nasal steroid spray.  Reviewed supportive care and red flags that should prompt return.  Pt expressed understanding and is in agreement w/ plan.

## 2011-02-21 NOTE — Assessment & Plan Note (Signed)
Will get labs at request of pain management MD and fax results.

## 2011-02-21 NOTE — Assessment & Plan Note (Signed)
Pt continues to gain weight.  Will refer to nutrition as this is pt's biggest problem.  This is impacting her lipids, her DM, her pain.  Stressed the importance of losing weight w/ pt- she expressed understanding.

## 2011-02-25 ENCOUNTER — Telehealth: Payer: Self-pay | Admitting: Family Medicine

## 2011-02-25 NOTE — Telephone Encounter (Signed)
Ok for 30 days 

## 2011-02-25 NOTE — Telephone Encounter (Signed)
Please advise 

## 2011-02-25 NOTE — Telephone Encounter (Signed)
Patient called to request refills for Hydrocodone-acet and Carisoprodol to be called in to [facility], [facility], [place], [place]---she only has two pills left and doesn't want to run out over weekend---explained policy to call pharmacy in future ---she agreed--  [facility] management cant do anything for 30 days

## 2011-02-26 MED ORDER — CARISOPRODOL 350 MG PO TABS: 350.0000 mg | ORAL_TABLET | Freq: Three times a day (TID) | ORAL | Status: DC | PRN

## 2011-02-26 MED ORDER — HYDROCODONE-ACETAMINOPHEN 7.5-500 MG PO TABS: 1.0000 | ORAL_TABLET | Freq: Four times a day (QID) | ORAL | Status: DC | PRN

## 2011-02-26 NOTE — Telephone Encounter (Signed)
Pt.notified

## 2011-02-26 NOTE — Telephone Encounter (Signed)
Rxs sent. Left message on voicemail to call the office

## 2011-04-14 ENCOUNTER — Other Ambulatory Visit: Payer: Self-pay | Admitting: Family Medicine

## 2011-04-14 MED ORDER — CARISOPRODOL 350 MG PO TABS: 350.0000 mg | ORAL_TABLET | Freq: Three times a day (TID) | ORAL | Status: DC | PRN

## 2011-04-14 NOTE — Telephone Encounter (Signed)
Last refilled 02/26/11. Please advise.

## 2011-04-14 NOTE — Telephone Encounter (Signed)
Ok for #90, no refills 

## 2011-04-14 NOTE — Telephone Encounter (Signed)
Refill sent.

## 2011-04-25 ENCOUNTER — Inpatient Hospital Stay (HOSPITAL_COMMUNITY)
Admission: AD | Admit: 2011-04-25 | Discharge: 2011-04-25 | Disposition: A | Discharge status: Home / Self Care | Payer: Medicare Other | Source: Ambulatory Visit | Attending: Obstetrics & Gynecology | Admitting: Obstetrics & Gynecology

## 2011-04-25 ENCOUNTER — Inpatient Hospital Stay (HOSPITAL_COMMUNITY): Payer: Medicare Other

## 2011-04-25 ENCOUNTER — Inpatient Hospital Stay (HOSPITAL_COMMUNITY): Payer: Medicare Other | Admitting: Obstetrics & Gynecology

## 2011-04-25 ENCOUNTER — Encounter (HOSPITAL_COMMUNITY): Payer: Self-pay | Admitting: Obstetrics and Gynecology

## 2011-04-25 DIAGNOSIS — N949 Unspecified condition associated with female genital organs and menstrual cycle: Secondary | ICD-10-CM | POA: Insufficient documentation

## 2011-04-25 DIAGNOSIS — N898 Other specified noninflammatory disorders of vagina: Secondary | ICD-10-CM

## 2011-04-25 DIAGNOSIS — R109 Unspecified abdominal pain: Secondary | ICD-10-CM | POA: Insufficient documentation

## 2011-04-25 DIAGNOSIS — N938 Other specified abnormal uterine and vaginal bleeding: Secondary | ICD-10-CM | POA: Insufficient documentation

## 2011-04-25 DIAGNOSIS — N939 Abnormal uterine and vaginal bleeding, unspecified: Secondary | ICD-10-CM

## 2011-04-25 HISTORY — DX: Polycystic ovarian syndrome: E28.2

## 2011-04-25 HISTORY — DX: Unspecified injury of head, initial encounter: S09.90XA

## 2011-04-25 LAB — URINALYSIS, ROUTINE W REFLEX MICROSCOPIC
Glucose, UA: NEGATIVE mg/dL
Ketones, ur: 15 mg/dL — AB
Leukocytes, UA: NEGATIVE
Protein, ur: NEGATIVE mg/dL
pH: 6 (ref 5.0–8.0)

## 2011-04-25 LAB — CBC
MCH: 29.3 pg (ref 26.0–34.0)
MCHC: 32.9 g/dL (ref 30.0–36.0)
Platelets: 258 10*3/uL (ref 150–400)

## 2011-04-25 LAB — URINE MICROSCOPIC-ADD ON

## 2011-04-25 LAB — HCG, QUANTITATIVE, PREGNANCY: hCG, Beta Chain, Quant, S: <1 m[IU]/mL (ref <5)

## 2011-04-25 LAB — DIFFERENTIAL
Basophils Absolute: 0 10*3/uL (ref 0.0–0.1)
Basophils Relative: 0 % (ref 0–1)
Eosinophils Absolute: 0.2 10*3/uL (ref 0.0–0.7)
Neutrophils Relative %: 61 % (ref 43–77)

## 2011-04-25 MED ORDER — OXYCODONE-ACETAMINOPHEN 5-325 MG PO TABS
1.0000 | ORAL_TABLET | Freq: Once | ORAL | Status: AC
  Administered 2011-04-25: 1 via ORAL
  Filled 2011-04-25 (×2): qty 1

## 2011-04-25 NOTE — ED Provider Notes (Signed)
History     Chief Complaint  Patient presents with  . Abdominal Pain  . Vaginal Bleeding    period did not come on 7/3 and on 7/7 had some bleeding, having coffee colored discharge  . Back Pain    after having rough sex   Patient is a 35 y.o. female presenting with abdominal pain, vaginal bleeding, and back pain. The history is provided by the patient.  Abdominal Pain The primary symptoms of the illness include abdominal pain, fever, nausea, dysuria and vaginal bleeding. The primary symptoms of the illness do not include shortness of breath. The onset of the illness was gradual. The problem has been gradually worsening.  The abdominal pain began more than 2 days ago. The pain came on gradually. The abdominal pain has been gradually worsening since its onset. The abdominal pain is located in the RLQ, suprapubic region, groin and right flank. The abdominal pain radiates to the back. The severity of the abdominal pain is 8/10. The abdominal pain is relieved by nothing. The abdominal pain is exacerbated by movement.  The dysuria is associated with frequency and urgency.   Additional symptoms associated with the illness include heartburn, constipation, urgency, frequency and back pain.  Vaginal Bleeding Associated symptoms include abdominal pain, a fever, headaches and nausea. Pertinent negatives include no coughing.  Back Pain  Associated symptoms include a fever, headaches, abdominal pain and dysuria.  Abdominal Pain The onset quality is gradual. The problem has been gradually worsening. The abdominal pain radiates to the back. Associated symptoms include constipation, dysuria, a fever, frequency, headaches and nausea.  Vaginal Bleeding Associated symptoms include abdominal pain, back pain, constipation, dysuria, a fever, frequency, headaches, nausea and urgency.  Back Pain Associated symptoms include abdominal pain, dysuria, a fever and headaches.      Past Medical History  Diagnosis  Date  . Asthma   . Arthritis   . Diabetes mellitus   . GERD (gastroesophageal reflux disease)   . Gout   . Pulmonary hypertension   . Allergy   . Head injury 2002  . PCOS (polycystic ovarian syndrome)     No past surgical history on file.  Family History  Problem Relation Age of Onset  . Hypertension Mother   . Diabetes Brother     History  Substance Use Topics  . Smoking status: Never Smoker   . Smokeless tobacco: Not on file  . Alcohol Use: No    Allergies:  Allergies  Allergen Reactions  . Naproxen     REACTION: GI upset    Prescriptions prior to admission  Medication Sig Dispense Refill  . albuterol (VENTOLIN HFA) 108 (90 BASE) MCG/ACT inhaler Inhale 2 puffs into the lungs every 6 (six) hours as needed.       . carisoprodol (SOMA) 350 MG tablet Take 1 tablet (350 mg total) by mouth 3 (three) times daily as needed.  90 tablet  0  . clotrimazole-betamethasone (LOTRISONE) cream Apply 1 application topically 2 (two) times daily.        Marland Kitchen HYDROcodone-acetaminophen (LORTAB) 7.5-500 MG per tablet Take 1 tablet by mouth every 6 (six) hours as needed.  30 tablet  0  . hydrocortisone 2.5 % ointment Apply 1 application topically 2 (two) times daily as needed. Apply to affected area two times a day as needed.      Marland Kitchen omeprazole (PRILOSEC) 20 MG capsule Take 20 mg by mouth daily.       Marland Kitchen allopurinol (ZYLOPRIM) 100 MG tablet Take  100 mg by mouth daily. As needed for gout flares.      Marland Kitchen ibuprofen (ADVIL,MOTRIN) 200 MG tablet Take 200 mg by mouth every 6 (six) hours as needed.        . metFORMIN (GLUCOPHAGE XR) 750 MG 24 hr tablet Take 1 tablet (750 mg total) by mouth daily with breakfast.  30 tablet  6  . rosuvastatin (CRESTOR) 40 MG tablet Take 40 mg by mouth daily.          Review of Systems  Constitutional: Positive for fever and malaise/fatigue.  Eyes: Positive for blurred vision, double vision and pain.  Respiratory: Negative for cough, shortness of breath and wheezing.     Gastrointestinal: Positive for heartburn, nausea, abdominal pain and constipation.  Genitourinary: Positive for dysuria, urgency, frequency and vaginal bleeding.  Musculoskeletal: Positive for back pain.  Neurological: Positive for dizziness, seizures and headaches.  Psychiatric/Behavioral: Positive for depression and substance abuse.   Physical Exam   Blood pressure 120/42, pulse 77, temperature 99.2 F (37.3 C), temperature source Oral, resp. rate 16, height 5\' 10"  (1.778 m), weight 409 lb 9.6 oz (185.793 kg), last menstrual period 03/21/2011.  Physical Exam  Constitutional: Vital signs are normal.  HENT:  Head: Normocephalic.  GI: Soft. There is no tenderness. There is no rebound and no CVA tenderness.       Morbid obesity.  Genitourinary: Cervix exhibits discharge. Cervix exhibits no motion tenderness. Right adnexum displays tenderness. There is bleeding around the vagina. Vaginal discharge found.       Difficulty with exam due to body habitus.  Neurological: She is alert.  Skin: Skin is warm and dry.    MAU Course  Procedures  MDM Pt. To follow up with her PCP at [facility]. Discussed with patient results of pregnancy test. Will treat pain until her f/u visit with her doctor.  Garey, NP 04/25/11 2050  Pt is not pregnant.  Endometrial stripe .8cm   Pinkney Venard H.

## 2011-04-25 NOTE — Initial Assessments (Signed)
LMP 03/21/2011 Pt presents to MAU and states had intercourse 1 week ago and states she master bated 2 days ago and began to bleed. Pt states the bleeding feels different that normal period bleeding. Pt states everyone in her family who has gotten pregnant had to have it confirmed with HCG. Pt states she is afraid she is having a miscarriage.

## 2011-04-26 LAB — GC/CHLAMYDIA PROBE AMP, GENITAL: GC Probe Amp, Genital: NEGATIVE

## 2011-05-07 ENCOUNTER — Other Ambulatory Visit: Payer: Self-pay | Admitting: Family Medicine

## 2011-05-07 MED ORDER — CARISOPRODOL 350 MG PO TABS: 350.0000 mg | ORAL_TABLET | Freq: Three times a day (TID) | ORAL | Status: DC | PRN

## 2011-05-07 NOTE — Telephone Encounter (Signed)
Last refilled 04/14/11. Please advise.

## 2011-05-07 NOTE — Telephone Encounter (Signed)
Faxed hard copy

## 2011-05-09 ENCOUNTER — Emergency Department (HOSPITAL_COMMUNITY): Payer: Medicare Other

## 2011-05-09 ENCOUNTER — Emergency Department (HOSPITAL_COMMUNITY)
Admission: EM | Admit: 2011-05-09 | Discharge: 2011-05-09 | Disposition: A | Discharge status: Home / Self Care | Payer: Medicare Other | Attending: Emergency Medicine | Admitting: Emergency Medicine

## 2011-05-09 DIAGNOSIS — R109 Unspecified abdominal pain: Secondary | ICD-10-CM | POA: Insufficient documentation

## 2011-05-09 DIAGNOSIS — G8929 Other chronic pain: Secondary | ICD-10-CM | POA: Insufficient documentation

## 2011-05-09 DIAGNOSIS — R197 Diarrhea, unspecified: Secondary | ICD-10-CM | POA: Insufficient documentation

## 2011-05-09 DIAGNOSIS — M546 Pain in thoracic spine: Secondary | ICD-10-CM | POA: Insufficient documentation

## 2011-05-09 DIAGNOSIS — R071 Chest pain on breathing: Secondary | ICD-10-CM | POA: Insufficient documentation

## 2011-05-09 DIAGNOSIS — E669 Obesity, unspecified: Secondary | ICD-10-CM | POA: Insufficient documentation

## 2011-05-09 DIAGNOSIS — R112 Nausea with vomiting, unspecified: Secondary | ICD-10-CM | POA: Insufficient documentation

## 2011-05-09 DIAGNOSIS — Z79899 Other long term (current) drug therapy: Secondary | ICD-10-CM | POA: Insufficient documentation

## 2011-05-09 DIAGNOSIS — E119 Type 2 diabetes mellitus without complications: Secondary | ICD-10-CM | POA: Insufficient documentation

## 2011-05-09 LAB — CBC
MCV: 86.9 fL (ref 78.0–100.0)
Platelets: 277 10*3/uL (ref 150–400)
RDW: 13.9 % (ref 11.5–15.5)
WBC: 9.8 10*3/uL (ref 4.0–10.5)

## 2011-05-09 LAB — BASIC METABOLIC PANEL
BUN: 6 mg/dL (ref 6–23)
CO2: 30 mEq/L (ref 19–32)
Calcium: 9.2 mg/dL (ref 8.4–10.5)
Chloride: 99 mEq/L (ref 96–112)
Creatinine, Ser: 0.54 mg/dL (ref 0.50–1.10)
GFR calc Af Amer: >60 mL/min (ref >60)

## 2011-05-09 LAB — DIFFERENTIAL
Basophils Relative: 1 % (ref 0–1)
Eosinophils Absolute: 0.3 10*3/uL (ref 0.0–0.7)
Eosinophils Relative: 3 % (ref 0–5)
Lymphs Abs: 3.2 10*3/uL (ref 0.7–4.0)
Neutrophils Relative %: 59 % (ref 43–77)

## 2011-05-09 LAB — CK TOTAL AND CKMB (NOT AT ARMC)
Relative Index: INVALID (ref 0.0–2.5)
Total CK: 91 U/L (ref 7–177)

## 2011-05-12 ENCOUNTER — Encounter: Payer: Self-pay | Admitting: Family Medicine

## 2011-05-12 ENCOUNTER — Encounter: Payer: Self-pay | Admitting: *Deleted

## 2011-05-12 ENCOUNTER — Ambulatory Visit (INDEPENDENT_AMBULATORY_CARE_PROVIDER_SITE_OTHER): Payer: Medicare Other | Admitting: Family Medicine

## 2011-05-12 DIAGNOSIS — E669 Obesity, unspecified: Secondary | ICD-10-CM

## 2011-05-12 DIAGNOSIS — E785 Hyperlipidemia, unspecified: Secondary | ICD-10-CM

## 2011-05-12 DIAGNOSIS — J309 Allergic rhinitis, unspecified: Secondary | ICD-10-CM

## 2011-05-12 DIAGNOSIS — E119 Type 2 diabetes mellitus without complications: Secondary | ICD-10-CM

## 2011-05-12 LAB — BASIC METABOLIC PANEL
BUN: 8 mg/dL (ref 6–23)
Chloride: 102 mEq/L (ref 96–112)
Glucose, Bld: 96 mg/dL (ref 70–99)
Potassium: 4 mEq/L (ref 3.5–5.1)

## 2011-05-12 LAB — LIPID PANEL
LDL Cholesterol: 116 mg/dL — ABNORMAL HIGH (ref 0–99)
VLDL: 17.8 mg/dL (ref 0.0–40.0)

## 2011-05-12 LAB — HEPATIC FUNCTION PANEL
Bilirubin, Direct: 0 mg/dL (ref 0.0–0.3)
Total Bilirubin: 0.5 mg/dL (ref 0.3–1.2)

## 2011-05-12 NOTE — Progress Notes (Signed)
  Subjective:    Patient ID: Kelsey Schmitt, female    DOB: Jun 03, 1976, 35 y.o.   MRN: [id number]  HPI Obesity- in April was 419.  Now down to 404.  Has changed her eating habits dramatically.  Is not able to exercise due to past injury.  Currently seeing pain management.  ? Sinus infxn- + nasal congestion, PND, denies fever.  Denies facial pain.  No sick contacts.  Has hx of seasonal allergies but is not currently taking meds  DM- chronic problem for pt, on Metformin.  Has lost 15 lbs.  Taking Metformin regularly.  Denies symptomatic lows, CP, SOB, visual changes, edema.   Review of Systems For ROS see HPI     Objective:   Physical Exam  Vitals reviewed. Constitutional: She is oriented to person, place, and time. She appears well-developed and well-nourished. No distress.  HENT:  Head: Normocephalic and atraumatic.       Bilateral turbinate edema + PND No TTP over sinuses  Eyes: Conjunctivae and EOM are normal. Pupils are equal, round, and reactive to light.  Neck: Normal range of motion. Neck supple. No thyromegaly present.  Cardiovascular: Normal rate, regular rhythm, normal heart sounds and intact distal pulses.   No murmur heard. Pulmonary/Chest: Effort normal and breath sounds normal. No respiratory distress.  Abdominal: Soft. She exhibits no distension. There is no tenderness.  Musculoskeletal: She exhibits no edema.  Lymphadenopathy:    She has no cervical adenopathy.  Neurological: She is alert and oriented to person, place, and time.  Skin: Skin is warm and dry.  Psychiatric: She has a normal mood and affect. Her behavior is normal.          Assessment & Plan:

## 2011-05-12 NOTE — Patient Instructions (Signed)
Follow up in 3 months to recheck weight Keep up the good work!!!  You look great! We'll notify you of your lab results Restart the Claritin or Zyrtec daily Continue the Soma for pain relief Call with any questions or concerns Have a great summer!

## 2011-05-23 NOTE — Assessment & Plan Note (Signed)
Check labs.  Adjust meds prn  

## 2011-05-23 NOTE — Assessment & Plan Note (Signed)
Pt is following healthy diet, taking meds regularly.  Asymptomatic.  Check labs and adjust meds prn.

## 2011-05-23 NOTE — Assessment & Plan Note (Signed)
Pt is focusing on healthy diet and has lost 15 lbs.  Unable to exercise due to previous injury.  Applauded her efforts.

## 2011-05-23 NOTE — Assessment & Plan Note (Signed)
Pt w/out evidence of infxn.  Pt's sxs are due to untreated seasonal allergies.  Restart oral antihistamine and nasal steroid.  Reviewed supportive care and red flags that should prompt return.  Pt expressed understanding and is in agreement w/ plan.

## 2011-06-03 ENCOUNTER — Other Ambulatory Visit: Payer: Self-pay | Admitting: Family Medicine

## 2011-06-03 MED ORDER — CARISOPRODOL 350 MG PO TABS: 350.0000 mg | ORAL_TABLET | Freq: Three times a day (TID) | ORAL | Status: DC | PRN

## 2011-06-03 NOTE — Telephone Encounter (Signed)
Last filled 05/07/11. Last OV 05/12/11

## 2011-06-03 NOTE — Telephone Encounter (Signed)
Ok for #90, 3 refills 

## 2011-06-03 NOTE — Telephone Encounter (Signed)
Done

## 2011-06-04 ENCOUNTER — Other Ambulatory Visit: Payer: Self-pay | Admitting: Family Medicine

## 2011-06-04 MED ORDER — CARISOPRODOL 350 MG PO TABS: 350.0000 mg | ORAL_TABLET | Freq: Three times a day (TID) | ORAL | Status: DC | PRN

## 2011-06-04 NOTE — Progress Notes (Signed)
Faxed to pharmacy- spoke w/ pharmacist who said pt's insurance would not fill until 8/21.  Called and told pt she had 2 options- waiting until 8/21 and getting the full #90 or paying out of pocket for the pills she will need.  Pt reports she will try and wait.

## 2011-06-04 NOTE — Telephone Encounter (Signed)
Patient called at 5:00 to see if prescription had been resent--went to nurses desk, spoke to [facility] and Dr Tabori--prescription was refaxed, then patient said we needed to call pharmacy (confirmed that pharmacy was correct) to "push it through"

## 2011-06-16 ENCOUNTER — Encounter: Payer: Medicare Other | Attending: Family Medicine | Admitting: *Deleted

## 2011-06-19 ENCOUNTER — Encounter: Payer: Self-pay | Admitting: *Deleted

## 2011-07-28 ENCOUNTER — Inpatient Hospital Stay (HOSPITAL_COMMUNITY)
Admission: AD | Admit: 2011-07-28 | Discharge: 2011-07-28 | Disposition: A | Discharge status: Home / Self Care | Payer: Medicare Other | Source: Ambulatory Visit | Attending: Obstetrics & Gynecology | Admitting: Obstetrics & Gynecology

## 2011-07-28 DIAGNOSIS — N949 Unspecified condition associated with female genital organs and menstrual cycle: Secondary | ICD-10-CM

## 2011-07-28 DIAGNOSIS — R109 Unspecified abdominal pain: Secondary | ICD-10-CM | POA: Insufficient documentation

## 2011-07-28 DIAGNOSIS — N910 Primary amenorrhea: Secondary | ICD-10-CM

## 2011-07-28 LAB — URINALYSIS, ROUTINE W REFLEX MICROSCOPIC
Glucose, UA: NEGATIVE mg/dL
Leukocytes, UA: NEGATIVE
Specific Gravity, Urine: 1.015 (ref 1.005–1.030)
pH: 7.5 (ref 5.0–8.0)

## 2011-07-28 LAB — POCT PREGNANCY, URINE: Preg Test, Ur: NEGATIVE

## 2011-07-28 LAB — WET PREP, GENITAL
Trich, Wet Prep: NONE SEEN
Yeast Wet Prep HPF POC: NONE SEEN

## 2011-07-28 MED ORDER — IBUPROFEN 400 MG PO TABS
400.0000 mg | ORAL_TABLET | Freq: Once | ORAL | Status: AC
  Administered 2011-07-28: 400 mg via ORAL
  Filled 2011-07-28: qty 1

## 2011-07-28 NOTE — Progress Notes (Signed)
Pt states her period hs been irregular for the past 3 months. Has a history (long time ago) of PCOS. Has had nausea in the morning and evening with vomiting once. Has generalized pain in arms and shoulders.

## 2011-07-28 NOTE — ED Provider Notes (Signed)
Kelsey Schmitt y.o.[id number] @Unknown  Chief Complaint  Patient presents with  . Abdominal Pain    SUBJECTIVE  HPI: Concerned that she has been amenorrheic since LNMP 05/22/11 and recently has had symptoms she relates to possible pregnancy. She had a bleeding pattern typical of PCOS 4 years; however her menses have been monthly for at least the past year until LMP in August. She's had nausea throughout the day particularly in the morning. She also has lower abdominal premenstrual-like cramping. She also reports left shoulder pain and a popping catching sensation in her left elbow. Her sister had similar sx with a tubal pregnancy.She had a condom accident  and requests blood pregnancy test. She would like to be checked for STDs. Has had stress from recent death in family.  Has PMD at [facility].  Past Medical History  Diagnosis Date  . Asthma   . Arthritis   . Diabetes mellitus   . GERD (gastroesophageal reflux disease)   . Gout   . Pulmonary hypertension   . Allergy   . Head injury 2002  . PCOS (polycystic ovarian syndrome)    Ob Hx: nulli[p Gyn Hx: Chlamydia x1  No past surgical history on file. History   Social History  . Marital Status: Single    Spouse Name: N/A    Number of Children: N/A  . Years of Education: N/A   Occupational History  . Not on file.   Social History Main Topics  . Smoking status: Never Smoker   . Smokeless tobacco: Not on file  . Alcohol Use: No  . Drug Use: No  . Sexually Active: Yes   Other Topics Concern  . Not on file   Social History Narrative  . No narrative on file   No current facility-administered medications on file prior to encounter.   Current Outpatient Prescriptions on File Prior to Encounter  Medication Sig Dispense Refill  . albuterol (VENTOLIN HFA) 108 (90 BASE) MCG/ACT inhaler Inhale 2 puffs into the lungs every 6 (six) hours as needed.       Marland Kitchen allopurinol (ZYLOPRIM) 100 MG tablet Take 100 mg by mouth daily.  As needed for gout flares.      . carisoprodol (SOMA) 350 MG tablet Take 1 tablet (350 mg total) by mouth 3 (three) times daily as needed.  90 tablet  3  . clotrimazole-betamethasone (LOTRISONE) cream Apply 1 application topically 2 (two) times daily.        Marland Kitchen HYDROcodone-acetaminophen (LORTAB) 7.5-500 MG per tablet Take 1 tablet by mouth every 6 (six) hours as needed.  30 tablet  0  . hydrocortisone 2.5 % ointment Apply 1 application topically 2 (two) times daily as needed. Apply to affected area two times a day as needed.      Marland Kitchen ibuprofen (ADVIL,MOTRIN) 200 MG tablet Take 200 mg by mouth every 6 (six) hours as needed.        . metFORMIN (GLUCOPHAGE XR) 750 MG 24 hr tablet Take 1 tablet (750 mg total) by mouth daily with breakfast.  30 tablet  6  . omeprazole (PRILOSEC) 20 MG capsule Take 20 mg by mouth daily.       . rosuvastatin (CRESTOR) 40 MG tablet Take 40 mg by mouth daily.         Allergies  Allergen Reactions  . Naproxen     REACTION: GI upset    ROS: Pertinent items in HPI  OBJECTIVE  BP 154/69  Pulse 89  Temp(Src) 99.1 F (37.3  C) (Oral)  Resp 22  Ht 5\' 10"  (1.778 m)  Wt 187.336 kg (413 lb)  BMI 59.26 kg/m2  SpO2 97%  LMP 05/22/2011   Physical Exam:  General: Morbidly obese pleasant female in NAD Abd: obese NT Pelvic: NEFG; vag and cx clean, white stringy discharge  No CMT unable to palpate uterus or adnexae  due to body habitus Back: No CVAT Ext: Full ROM shoulders and elbow without pain   ASSESSMENT  Amenorrhea   PLAN   Henrietta Hoover PA assumed care at 9:10 pm  I have assumed care of this pt.  Results for orders placed during the hospital encounter of 07/28/11 (from the past 24 hour(s))  URINALYSIS, ROUTINE W REFLEX MICROSCOPIC     Status: Normal   Collection Time   07/28/11  6:40 PM      Component Value Range   Color, Urine YELLOW  YELLOW    Appearance CLEAR  CLEAR    Specific Gravity, Urine 1.015  1.005 - 1.030    pH 7.5  5.0 - 8.0    Glucose,  UA NEGATIVE  NEGATIVE (mg/dL)   Hgb urine dipstick NEGATIVE  NEGATIVE    Bilirubin Urine NEGATIVE  NEGATIVE    Ketones, ur NEGATIVE  NEGATIVE (mg/dL)   Protein, ur NEGATIVE  NEGATIVE (mg/dL)   Urobilinogen, UA 0.2  0.0 - 1.0 (mg/dL)   Nitrite NEGATIVE  NEGATIVE    Leukocytes, UA NEGATIVE  NEGATIVE   POCT PREGNANCY, URINE     Status: Normal   Collection Time   07/28/11  7:05 PM      Component Value Range   Preg Test, Ur NEGATIVE    HCG, SERUM, QUALITATIVE     Status: Normal   Collection Time   07/28/11  8:50 PM      Component Value Range   Preg, Serum NEGATIVE  NEGATIVE   WET PREP, GENITAL     Status: Abnormal   Collection Time   07/28/11  9:05 PM      Component Value Range   Yeast, Wet Prep NONE SEEN  NONE SEEN    Trich, Wet Prep NONE SEEN  NONE SEEN    Clue Cells, Wet Prep FEW (*) NONE SEEN    WBC, Wet Prep HPF POC FEW (*) NONE SEEN    A/P: delayed menses: discussed with pt at length. She is not pregnant. She is to f/u with her PCP. Discussed diet, activity, risks, and precautions.  Clinton Gallant. Rice III, DrHSc, MPAS, PA-C   Henrietta Hoover, [place] 07/28/11 2144

## 2011-07-28 NOTE — Progress Notes (Signed)
Pt states she has had irregular for the past 2 months. Pt states she has seen spotting around her period , and also 1 day of light bleeding last month.

## 2011-07-29 LAB — GC/CHLAMYDIA PROBE AMP, GENITAL: Chlamydia, DNA Probe: NEGATIVE

## 2011-08-12 ENCOUNTER — Ambulatory Visit: Payer: Medicare Other | Admitting: Family Medicine

## 2011-08-19 ENCOUNTER — Ambulatory Visit (INDEPENDENT_AMBULATORY_CARE_PROVIDER_SITE_OTHER): Payer: Medicare Other | Admitting: Family Medicine

## 2011-08-19 ENCOUNTER — Encounter: Payer: Self-pay | Admitting: Family Medicine

## 2011-08-19 DIAGNOSIS — E119 Type 2 diabetes mellitus without complications: Secondary | ICD-10-CM

## 2011-08-19 DIAGNOSIS — I272 Pulmonary hypertension, unspecified: Secondary | ICD-10-CM

## 2011-08-19 DIAGNOSIS — R42 Dizziness and giddiness: Secondary | ICD-10-CM

## 2011-08-19 DIAGNOSIS — I2789 Other specified pulmonary heart diseases: Secondary | ICD-10-CM

## 2011-08-19 DIAGNOSIS — E785 Hyperlipidemia, unspecified: Secondary | ICD-10-CM

## 2011-08-19 NOTE — Progress Notes (Signed)
  Subjective:    Patient ID: Kelsey Schmitt, female    DOB: 08/22/1976, 35 y.o.   MRN: [id number]  HPI DM- chronic problem for pt, on Metformin.  Not checking sugars. Denies symptomatic lows, CP, visual changes.   HTN- chronic problem, not currently on meds.  No CP, SOB above baseline.  + HA.  Dizziness- 'feels like my brain is expanding', worse w/ position changes.  Has not eaten today.  Not eating regularly.  sxs started 2 weeks ago- at that time stopped Lyrica and started weaning off pain meds.  Increased neck pain- radiating into shoulder, head, ear.  This started when pt stopped lyrica and started weaning pain meds.  Chest wall pain- R sided, located in breast.  Will change location.  Review of Systems For ROS see HPI     Objective:   Physical Exam  Vitals reviewed. Constitutional: She is oriented to person, place, and time. She appears well-developed and well-nourished. No distress.  HENT:  Head: Normocephalic and atraumatic.  Eyes: Conjunctivae and EOM are normal. Pupils are equal, round, and reactive to light.  Neck: Normal range of motion. Neck supple. No thyromegaly present.  Cardiovascular: Normal rate, regular rhythm, normal heart sounds and intact distal pulses.   No murmur heard. Pulmonary/Chest: Effort normal and breath sounds normal. No respiratory distress.  Abdominal: Soft. She exhibits no distension. There is no tenderness.  Musculoskeletal: She exhibits no edema.  Lymphadenopathy:    She has no cervical adenopathy.  Neurological: She is alert and oriented to person, place, and time. She has normal reflexes. No cranial nerve deficit. Coordination normal.  Skin: Skin is warm and dry.  Psychiatric: She has a normal mood and affect. Her behavior is normal.          Assessment & Plan:

## 2011-08-19 NOTE — Patient Instructions (Signed)
Follow up in 1 month to recheck dizziness We'll notify you of your lab results Someone will call you with your pulmonary appt Make sure you are eating regularly Drink plenty of water Change positions slowly Your dizziness may be related to coming off the pain meds Call with any questions or concerns Hang in there!

## 2011-08-20 LAB — CBC WITH DIFFERENTIAL/PLATELET
Basophils Absolute: 0 10*3/uL (ref 0.0–0.1)
HCT: 35.2 % — ABNORMAL LOW (ref 36.0–46.0)
Hemoglobin: 11.6 g/dL — ABNORMAL LOW (ref 12.0–15.0)
Lymphs Abs: 3.2 10*3/uL (ref 0.7–4.0)
MCHC: 32.8 g/dL (ref 30.0–36.0)
Monocytes Relative: 9 % (ref 3.0–12.0)
Neutro Abs: 1.4 10*3/uL (ref 1.4–7.7)
RDW: 14.8 % — ABNORMAL HIGH (ref 11.5–14.6)

## 2011-08-20 LAB — TSH: TSH: 0.79 u[IU]/mL (ref 0.35–5.50)

## 2011-08-20 LAB — HEPATIC FUNCTION PANEL
Albumin: 3.8 g/dL (ref 3.5–5.2)
Bilirubin, Direct: 0 mg/dL (ref 0.0–0.3)
Total Protein: 7.4 g/dL (ref 6.0–8.3)

## 2011-08-20 LAB — BASIC METABOLIC PANEL
BUN: 5 mg/dL — ABNORMAL LOW (ref 6–23)
CO2: 25 mEq/L (ref 19–32)
Calcium: 8.5 mg/dL (ref 8.4–10.5)
Creatinine, Ser: 0.7 mg/dL (ref 0.4–1.2)
Glucose, Bld: 90 mg/dL (ref 70–99)

## 2011-08-20 LAB — HEMOGLOBIN A1C: Hgb A1c MFr Bld: 6.7 % — ABNORMAL HIGH (ref 4.6–6.5)

## 2011-08-20 LAB — LIPID PANEL
Cholesterol: 187 mg/dL (ref 0–200)
Triglycerides: 68 mg/dL (ref 0.0–149.0)

## 2011-08-24 ENCOUNTER — Encounter: Payer: Self-pay | Admitting: *Deleted

## 2011-08-24 ENCOUNTER — Telehealth: Payer: Self-pay | Admitting: *Deleted

## 2011-08-24 MED ORDER — HYDROCHLOROTHIAZIDE 12.5 MG PO CAPS: 12.5000 mg | ORAL_CAPSULE | Freq: Every day | ORAL | Status: DC

## 2011-08-24 MED ORDER — ROSUVASTATIN CALCIUM 40 MG PO TABS: 40.0000 mg | ORAL_TABLET | Freq: Every day | ORAL | Status: DC

## 2011-08-24 NOTE — Assessment & Plan Note (Signed)
Chronic problem.  On Metformin.  Not checking sugars.  Asymptomatic w/ exception of dizziness (see below). Check labs.  Adjust meds prn.

## 2011-08-24 NOTE — Assessment & Plan Note (Signed)
Refer to Pulm at pt's request.

## 2011-08-24 NOTE — Telephone Encounter (Signed)
Should start HCTZ 12.5 mg daily for BP.

## 2011-08-24 NOTE — Telephone Encounter (Signed)
Spoke to pt to advise results and instruction. Pt understood. rx sent to pharmacy by e-script For crestor refill. Pt advised that she visited her pain management MD yesterday and BP was 150/90 and does not seem to be coming down and wanted to know any steps she could take

## 2011-08-24 NOTE — Assessment & Plan Note (Signed)
Chronic problem for pt.  Reports she is taking statin w/out difficulty.  Check labs.  Adjust meds prn.  Pt expressed understanding and is in agreement w/ plan.

## 2011-08-24 NOTE — Telephone Encounter (Signed)
Spoke to pt to advise start HCTZ 12.5mg . rx sent to pharmacy by e-script' pt understood. Pt advised she had a roommate with scabies and is concerned with possibility per has a bite on eye and back. MD advised that scabies do not bite, if she starts all over body itching then to call  us back. Pt understood

## 2011-08-24 NOTE — Assessment & Plan Note (Signed)
Likely multifactorial- stopping lyrica, weaning pain meds, increased neck pain.  Will also check labs to ensure it is not hypoglycemia.  Encouraged pt to eat regularly, increase fluid intake, change positions slowly.  Reviewed supportive care and red flags that should prompt return.  Pt expressed understanding and is in agreement w/ plan.

## 2011-08-26 ENCOUNTER — Other Ambulatory Visit: Payer: Self-pay | Admitting: *Deleted

## 2011-08-26 NOTE — Telephone Encounter (Signed)
Spoke to pt to advise the crestor and HCTZ medications have been sent to pharmacy for pick up. Pt understood.

## 2011-09-03 ENCOUNTER — Institutional Professional Consult (permissible substitution): Payer: Medicare Other | Admitting: Internal Medicine

## 2011-09-20 ENCOUNTER — Ambulatory Visit: Payer: Medicare Other | Admitting: Family Medicine

## 2011-09-30 ENCOUNTER — Encounter: Payer: Self-pay | Admitting: Emergency Medicine

## 2011-09-30 ENCOUNTER — Ambulatory Visit (INDEPENDENT_AMBULATORY_CARE_PROVIDER_SITE_OTHER): Payer: Medicare Other | Admitting: Emergency Medicine

## 2011-09-30 DIAGNOSIS — I2789 Other specified pulmonary heart diseases: Secondary | ICD-10-CM

## 2011-09-30 DIAGNOSIS — J45909 Unspecified asthma, uncomplicated: Secondary | ICD-10-CM

## 2011-09-30 DIAGNOSIS — G4730 Sleep apnea, unspecified: Secondary | ICD-10-CM

## 2011-09-30 NOTE — Progress Notes (Signed)
Subjective:    Patient ID: Kelsey Schmitt, female    DOB: [birth year], 35 y.o.   MRN: [id number]  HPI 35 yo obese never smoker, Hx PCOS, OSA with poor CPAP compliance, PAH dx in [place], [place], at [employer], [employer]. Also w hx DM, fibromyalgia, medical non-compliance. Has frequent bronchitis and has been told she might have asthma. Followed now locally by Dr Beverely Low. She is referred for eval of PAH, has been having trouble with some lightheadedness and dizziness for about a year. Has some pre-syncopal symptoms, has never had full syncope. She has occas CP, not necessarily w exertion.    Review of Systems  Constitutional: Negative.  Negative for fever and unexpected weight change.  HENT: Positive for sneezing. Negative for ear pain, nosebleeds, congestion, sore throat, rhinorrhea, trouble swallowing, dental problem, postnasal drip and sinus pressure.   Eyes: Negative.  Negative for redness and itching.  Respiratory: Positive for shortness of breath. Negative for cough, chest tightness and wheezing.   Cardiovascular: Positive for leg swelling. Negative for palpitations.  Gastrointestinal: Positive for abdominal pain. Negative for nausea and vomiting.  Genitourinary: Negative.  Negative for dysuria.  Musculoskeletal: Positive for joint swelling.  Skin: Negative.  Negative for rash.  Neurological: Positive for dizziness. Negative for headaches.  Hematological: Negative.  Does not bruise/bleed easily.  Psychiatric/Behavioral: Negative.  Negative for dysphoric mood. The patient is not nervous/anxious.     Past Medical History  Diagnosis Date  . Asthma   . Arthritis   . Diabetes mellitus   . GERD (gastroesophageal reflux disease)   . Gout   . Pulmonary hypertension   . Allergy   . Head injury 2002  . PCOS (polycystic ovarian syndrome)      Family History  Problem Relation Age of Onset  . Hypertension Mother   . Diabetes Brother   . Hypertension Brother   . Lung cancer  Maternal Uncle   . Breast cancer Maternal Aunt   . Heart disease Mother   . Heart disease Father   . Heart disease Paternal Grandfather   . Rheum arthritis Mother      History   Social History  . Marital Status: Single    Spouse Name: N/A    Number of Children: N/A  . Years of Education: N/A   Occupational History  . Not on file.   Social History Main Topics  . Smoking status: Never Smoker   . Smokeless tobacco: Not on file  . Alcohol Use: No  . Drug Use: No  . Sexually Active: Yes   Other Topics Concern  . Not on file   Social History Narrative  . No narrative on file     Allergies  Allergen Reactions  . Ibuprofen     Stomach ache   . Naproxen     REACTION: GI upset     Outpatient Prescriptions Prior to Visit  Medication Sig Dispense Refill  . albuterol (VENTOLIN HFA) 108 (90 BASE) MCG/ACT inhaler Inhale 2 puffs into the lungs every 6 (six) hours as needed.       Marland Kitchen allopurinol (ZYLOPRIM) 100 MG tablet Take 100 mg by mouth daily. As needed for gout flares.      . carisoprodol (SOMA) 350 MG tablet Take 1 tablet (350 mg total) by mouth 3 (three) times daily as needed.  90 tablet  3  . hydrochlorothiazide (MICROZIDE) 12.5 MG capsule Take 1 capsule (12.5 mg total) by mouth daily.  30 capsule  3  . HYDROcodone-acetaminophen (  LORTAB) 7.5-500 MG per tablet Take 1 tablet by mouth every 6 (six) hours as needed.  30 tablet  0  . hydrocortisone 2.5 % ointment Apply 1 application topically 2 (two) times daily as needed. Apply to affected area two times a day as needed.      Marland Kitchen ibuprofen (ADVIL,MOTRIN) 200 MG tablet Take 200 mg by mouth every 6 (six) hours as needed.        . clotrimazole-betamethasone (LOTRISONE) cream Apply 1 application topically 2 (two) times daily.        . metFORMIN (GLUCOPHAGE XR) 750 MG 24 hr tablet Take 1 tablet (750 mg total) by mouth daily with breakfast.  30 tablet  6  . omeprazole (PRILOSEC) 20 MG capsule Take 20 mg by mouth daily.       .  rosuvastatin (CRESTOR) 40 MG tablet Take 1 tablet (40 mg total) by mouth daily.  30 tablet  5         Objective:   Physical Exam Gen: Pleasant, morbidly obese, in no distress,  normal affect  ENT: No lesions,  mouth clear,  oropharynx clear, no postnasal drip  Neck: No JVD, no TMG, no carotid bruits  Lungs: No use of accessory muscles, no dullness to percussion, clear without rales or rhonchi  Cardiovascular: RRR, heart sounds normal, no murmur or gallops, no peripheral edema  Musculoskeletal: No deformities, no cyanosis or clubbing  Neuro: alert, non focal  Skin: Warm, no lesions or rashes      Assessment & Plan:  SLEEP APNEA Needs repeat PSG locally and then setup CPAP - split night study and f/u w me, probably back to [place]   PULMONARY HYPERTENSION - will get records from [place] - perform TTE now to assess pulm pressures. If elevated then will proceed w workup and consider R heart cath - rov 2 months  ASTHMA questionable dx - will check full PFT

## 2011-09-30 NOTE — Assessment & Plan Note (Signed)
questionable dx - will check full PFT

## 2011-09-30 NOTE — Assessment & Plan Note (Signed)
-   will get records from [place] - perform TTE now to assess pulm pressures. If elevated then will proceed w workup and consider R heart cath - rov 2 months

## 2011-09-30 NOTE — Assessment & Plan Note (Signed)
Needs repeat PSG locally and then setup CPAP - split night study and f/u w me, probably back to [facility]

## 2011-09-30 NOTE — Patient Instructions (Signed)
We will perform an echocardiogram and discuss it at your next visit We will perform a sleep study We will get your records from [place] Follow with Dr Delton Coombes in 2 months with pulmonary function testing.

## 2011-10-04 ENCOUNTER — Ambulatory Visit (HOSPITAL_COMMUNITY): Payer: Medicare Other | Attending: Emergency Medicine | Admitting: Radiology

## 2011-10-04 ENCOUNTER — Telehealth: Payer: Self-pay | Admitting: *Deleted

## 2011-10-04 DIAGNOSIS — I079 Rheumatic tricuspid valve disease, unspecified: Secondary | ICD-10-CM | POA: Insufficient documentation

## 2011-10-04 DIAGNOSIS — I2789 Other specified pulmonary heart diseases: Secondary | ICD-10-CM

## 2011-10-04 DIAGNOSIS — R0989 Other specified symptoms and signs involving the circulatory and respiratory systems: Secondary | ICD-10-CM

## 2011-10-04 DIAGNOSIS — R0609 Other forms of dyspnea: Secondary | ICD-10-CM | POA: Insufficient documentation

## 2011-10-04 MED ORDER — LISINOPRIL 10 MG PO TABS: 10.0000 mg | ORAL_TABLET | Freq: Every day | ORAL | Status: DC

## 2011-10-04 NOTE — Telephone Encounter (Signed)
Spoke to [employer] from [employer] office to advise pt BP in their office is currently 180/120 MD Tabori gave verbal orders to start lisinopril 10mg  daily advised pt when she then got on the phone to please follow up next week however the pt needs to go to ER if pt symptoms worsens. Pt understood Sent rx via escribe

## 2011-10-13 ENCOUNTER — Encounter (HOSPITAL_BASED_OUTPATIENT_CLINIC_OR_DEPARTMENT_OTHER): Payer: Medicare Other

## 2011-10-13 ENCOUNTER — Encounter (HOSPITAL_COMMUNITY): Payer: Self-pay | Admitting: Emergency Medicine

## 2011-10-13 ENCOUNTER — Other Ambulatory Visit: Payer: Self-pay

## 2011-10-13 ENCOUNTER — Emergency Department (HOSPITAL_COMMUNITY)
Admission: EM | Admit: 2011-10-13 | Discharge: 2011-10-14 | Disposition: A | Discharge status: Home / Self Care | Payer: Medicare Other | Attending: Emergency Medicine | Admitting: Emergency Medicine

## 2011-10-13 ENCOUNTER — Emergency Department (HOSPITAL_COMMUNITY): Payer: Medicare Other

## 2011-10-13 DIAGNOSIS — M25519 Pain in unspecified shoulder: Secondary | ICD-10-CM | POA: Insufficient documentation

## 2011-10-13 DIAGNOSIS — R059 Cough, unspecified: Secondary | ICD-10-CM | POA: Insufficient documentation

## 2011-10-13 DIAGNOSIS — R42 Dizziness and giddiness: Secondary | ICD-10-CM | POA: Insufficient documentation

## 2011-10-13 DIAGNOSIS — R072 Precordial pain: Secondary | ICD-10-CM | POA: Insufficient documentation

## 2011-10-13 DIAGNOSIS — M79609 Pain in unspecified limb: Secondary | ICD-10-CM | POA: Insufficient documentation

## 2011-10-13 DIAGNOSIS — J45909 Unspecified asthma, uncomplicated: Secondary | ICD-10-CM | POA: Insufficient documentation

## 2011-10-13 DIAGNOSIS — R0602 Shortness of breath: Secondary | ICD-10-CM | POA: Insufficient documentation

## 2011-10-13 DIAGNOSIS — R05 Cough: Secondary | ICD-10-CM | POA: Insufficient documentation

## 2011-10-13 HISTORY — DX: Obesity, unspecified: E66.9

## 2011-10-13 HISTORY — DX: Fibromyalgia: M79.7

## 2011-10-13 LAB — BASIC METABOLIC PANEL
Chloride: 100 mEq/L (ref 96–112)
GFR calc Af Amer: >90 mL/min (ref >90)
GFR calc non Af Amer: >90 mL/min (ref >90)
Glucose, Bld: 99 mg/dL (ref 70–99)
Potassium: 3.5 mEq/L (ref 3.5–5.1)
Sodium: 138 mEq/L (ref 135–145)

## 2011-10-13 LAB — CBC
Hemoglobin: 12.7 g/dL (ref 12.0–15.0)
MCHC: 33 g/dL (ref 30.0–36.0)
RDW: 14 % (ref 11.5–15.5)
WBC: 15.2 10*3/uL — ABNORMAL HIGH (ref 4.0–10.5)

## 2011-10-13 LAB — POCT I-STAT TROPONIN I

## 2011-10-13 NOTE — ED Notes (Signed)
PT. REPORTS MIDSTERNAL CHEST PAIN FOR 1 1/2 MONTHS WITH RIGHT SHOULDER AND RIGHT ARM PAIN , SLIGHT SOB ,  PRODUCTIVE COUGH WITH VOMITTING , ALSO REPORTS ELEVATED BP 180/102 LAST WEEK .

## 2011-10-14 ENCOUNTER — Other Ambulatory Visit: Payer: Self-pay | Admitting: Family Medicine

## 2011-10-14 LAB — PRO B NATRIURETIC PEPTIDE: Pro B Natriuretic peptide (BNP): 6 pg/mL (ref 0–125)

## 2011-10-14 LAB — D-DIMER, QUANTITATIVE: D-Dimer, Quant: 0.36 ug/mL-FEU (ref 0.00–0.48)

## 2011-10-14 MED ORDER — BENZONATATE 100 MG PO CAPS
100.0000 mg | ORAL_CAPSULE | Freq: Once | ORAL | Status: AC
  Administered 2011-10-14: 100 mg via ORAL
  Filled 2011-10-14: qty 1

## 2011-10-14 MED ORDER — BENZONATATE 100 MG PO CAPS: 100.0000 mg | ORAL_CAPSULE | Freq: Three times a day (TID) | ORAL | Status: AC | PRN

## 2011-10-14 NOTE — ED Notes (Signed)
Pt stated that last week she went to her primary with elevated BP. Primary placed her on Lisinopril and since then she has been coughing. Currently not coughing. Pt states that she also has been having SOB with coughing. Pt was slightly SOB when arriving (pt walked to room). However, there is no additional respiratory distress. Lung sounds are clear. Pt complaining of midsternal chest pain with coughing since last week. Currently no chest pain. Heart sounds heard and WNL. No nausea/vomiting. Pt also complaining of abdominal pain radiating to lower back. Current pain 5 out of 10. Bowel sounds heard. No palpable tenderness at this time. Will continue to monitor.

## 2011-10-14 NOTE — ED Provider Notes (Signed)
History     CSN: [id number]  Arrival date & time 10/13/11  1911   First MD Initiated Contact with Patient 10/14/11 0036      Chief Complaint  Patient presents with  . Chest Pain    (Consider location/radiation/quality/duration/timing/severity/associated sxs/prior treatment) HPI  35yoF h/o DM, HTN, ?pHTN, fibromyalgia pw multiple complaints. She states for the past 1-1/2 months she has experience lightheadedness and dizziness. Slight shortness of breath. She states that her blood pressure was elevated at 180/102 last week and about one month ago. She was started on lisinopril as an outpatient and he says she's had minimally productive cough since starting the Cipro. She discontinued it 3 days ago. She continues to have a cough. She feels short of breath after coughing. There is no shortness of breath with exertion. She states that she has midsternal chest pain which has been recurrent over the past one and half months not related to exertion. There is no radiation of the pain in her back. She states that she has new right arm pain for the past 2 days she has not previously experienced and she wanted to make sure she was not having a heart attack. She's not having any chest pain or shortness of breath at this time. She denies syncopal episodes. She denies numbness, tingling, weakness of her extremities. Denies h/o VTE in self or family. No recent hosp/surg/immob. No h/o cancer. Denies exogenous hormone use, no leg pain or swelling She recently had a workup for lightheadedness by cardiology which included an echocardiogram 10/04/2011 remarkable for LVH EF 50-55%.   ED Notes, ED Provider Notes from 10/13/11 0000 to 10/13/11 21:38:15       Nada Libman, RN 10/13/2011 19:38      PT. REPORTS MIDSTERNAL CHEST PAIN FOR 1 1/2 MONTHS WITH RIGHT SHOULDER AND RIGHT ARM PAIN , SLIGHT SOB , PRODUCTIVE COUGH WITH VOMITTING , ALSO REPORTS ELEVATED BP 180/102 LAST WEEK .      Past Medical History   Diagnosis Date  . Asthma   . Arthritis   . Diabetes mellitus   . GERD (gastroesophageal reflux disease)   . Gout   . Pulmonary hypertension   . Allergy   . Head injury 2002  . PCOS (polycystic ovarian syndrome)   . Fibromyalgia   . Obesity     Past Surgical History  Procedure Date  . No past surgeries     Family History  Problem Relation Age of Onset  . Hypertension Mother   . Diabetes Brother   . Hypertension Brother   . Lung cancer Maternal Uncle   . Breast cancer Maternal Aunt   . Heart disease Mother   . Heart disease Father   . Heart disease Paternal Grandfather   . Rheum arthritis Mother     History  Substance Use Topics  . Smoking status: Never Smoker   . Smokeless tobacco: Not on file  . Alcohol Use: No    OB History    Grav Para Term Preterm Abortions TAB SAB Ect Mult Living   0               Review of Systems  All other systems reviewed and are negative.   except as noted HPI   Allergies  Ibuprofen and Naproxen  Home Medications   Current Outpatient Rx  Name Route Sig Dispense Refill  . ALBUTEROL SULFATE HFA 108 (90 BASE) MCG/ACT IN AERS Inhalation Inhale 2 puffs into the lungs every 6 (six) hours  as needed. For shortness of breath    . ALLOPURINOL 100 MG PO TABS Oral Take 100 mg by mouth daily. As needed for gout flares.    Marland Kitchen CARISOPRODOL 350 MG PO TABS Oral Take 350 mg by mouth 3 (three) times daily as needed. For pain     . CLOTRIMAZOLE-BETAMETHASONE 1-0.05 % EX CREA Topical Apply 1 application topically 2 (two) times daily.      Marland Kitchen HYDROCHLOROTHIAZIDE 12.5 MG PO CAPS Oral Take 12.5 mg by mouth daily.      Marland Kitchen HYDROCODONE-ACETAMINOPHEN 7.5-500 MG PO TABS Oral Take 1 tablet by mouth every 6 (six) hours as needed. For pain     . IBUPROFEN 200 MG PO TABS Oral Take 200 mg by mouth every 6 (six) hours as needed. For pain    . METFORMIN HCL ER 750 MG PO TB24 Oral Take 1 tablet (750 mg total) by mouth daily with breakfast. 30 tablet 6  .  OMEPRAZOLE 20 MG PO CPDR Oral Take 20 mg by mouth daily.     Marland Kitchen ROSUVASTATIN CALCIUM 40 MG PO TABS Oral Take 40 mg by mouth daily.      Marland Kitchen BENZONATATE 100 MG PO CAPS Oral Take 1 capsule (100 mg total) by mouth 3 (three) times daily as needed for cough. 20 capsule 0    BP 147/64  Pulse 91  Temp(Src) 98.3 F (36.8 C) (Oral)  Resp 16  SpO2 100%  LMP 08/10/2011  Physical Exam  Nursing note and vitals reviewed. Constitutional: She is oriented to person, place, and time. She appears well-developed.  HENT:  Head: Atraumatic.  Mouth/Throat: Oropharynx is clear and moist.  Eyes: Conjunctivae and EOM are normal. Pupils are equal, round, and reactive to light.  Neck: Normal range of motion. Neck supple.  Cardiovascular: Normal rate, regular rhythm, normal heart sounds and intact distal pulses.   Pulmonary/Chest: Effort normal and breath sounds normal. No respiratory distress. She has no wheezes. She has no rales. She exhibits tenderness.  Abdominal: Soft. She exhibits no distension. There is no tenderness. There is no rebound and no guarding.  Musculoskeletal: Normal range of motion. She exhibits edema.       Trace pitting edema b/l LE  Neurological: She is alert and oriented to person, place, and time.  Skin: Skin is warm and dry. No rash noted.  Psychiatric: She has a normal mood and affect.      Date: 10/14/2011  Rate: 90  Rhythm: normal sinus rhythm  QRS Axis: normal  Intervals: normal  ST/T Wave abnormalities: normal  Conduction Disutrbances:none  Narrative Interpretation:   Old EKG Reviewed: none available   ED Course  Procedures (including critical care time)  Labs Reviewed  CBC - Abnormal; Notable for the following:    WBC 15.2 (*)    All other components within normal limits  GLUCOSE, CAPILLARY - Abnormal; Notable for the following:    Glucose-Capillary 108 (*)    All other components within normal limits  BASIC METABOLIC PANEL  POCT I-STAT TROPONIN I  D-DIMER,  QUANTITATIVE  PRO B NATRIURETIC PEPTIDE  POCT I-STAT TROPONIN I   Dg Chest 2 View  10/13/2011  *RADIOLOGY REPORT*  Clinical Data: Chest pain.  Coughing.  Right shoulder pain.  High blood pressure.  Nonsmoker.  CHEST - 2 VIEW  Comparison: 05/09/2011.  Findings: No infiltrate, congestive heart failure or pneumothorax. Heart size within normal limits.  Mild degenerative changes thoracic spine.  IMPRESSION: No infiltrate, congestive heart failure or pneumothorax.  Original Report Authenticated By: Fuller Canada, M.D.     1. Dizziness   2. Shortness of breath   3. Cough     MDM  She presents with multiple complaints including atypical cp. Her exam is unremarkable here. Her EKG is unremarkable. She does have a leukocytosis. There is no infiltrate or chest x-ray she is not complaining of UTI symptoms. He appears well here she is to cardiac enzymes which were unremarkable. She's feeling better for Jerilynn Som given in the emergency department. Patient ambulated without difficulty including no chest pain or shortness of breath. She states she is ready to go home. With difficult for discharge to follow with her cardiologist and primary care doctor within the week for recheck. Precautions for return.        Forbes Cellar, MD 10/14/11 [phone]

## 2011-10-14 NOTE — ED Notes (Signed)
Pt discharge instructions were given. Follow-up care provided. Pt instructed to come back to ED if symptoms worsen.

## 2011-10-14 NOTE — ED Notes (Signed)
CBG noted to be 108. (TIME: 0112)

## 2011-10-15 ENCOUNTER — Telehealth: Payer: Self-pay | Admitting: Family Medicine

## 2011-10-15 MED ORDER — CARISOPRODOL 350 MG PO TABS: 350.0000 mg | ORAL_TABLET | Freq: Three times a day (TID) | ORAL | Status: DC | PRN

## 2011-10-15 NOTE — Telephone Encounter (Signed)
.  rx faxed to pharmacy, manually.  

## 2011-10-15 NOTE — Telephone Encounter (Signed)
Last OV 08-19-11 last refill 12-30-10 #90 with 1 refill

## 2011-10-15 NOTE — Telephone Encounter (Signed)
Patient states that she is out of carisoprodol and would like refill sent to [facility] on [address].

## 2011-10-15 NOTE — Telephone Encounter (Signed)
Ok for #90, 3 refills 

## 2011-10-18 ENCOUNTER — Other Ambulatory Visit: Payer: Self-pay | Admitting: Family Medicine

## 2011-10-18 MED ORDER — OMEPRAZOLE 20 MG PO CPDR: 20.0000 mg | DELAYED_RELEASE_CAPSULE | Freq: Every day | ORAL | Status: DC

## 2011-10-18 NOTE — Telephone Encounter (Signed)
rx sent to pharmacy by e-script  

## 2011-10-27 ENCOUNTER — Encounter: Payer: Self-pay | Admitting: Family Medicine

## 2011-10-27 ENCOUNTER — Ambulatory Visit (INDEPENDENT_AMBULATORY_CARE_PROVIDER_SITE_OTHER): Payer: Medicare Other | Admitting: Family Medicine

## 2011-10-27 DIAGNOSIS — I10 Essential (primary) hypertension: Secondary | ICD-10-CM

## 2011-10-27 DIAGNOSIS — Z20828 Contact with and (suspected) exposure to other viral communicable diseases: Secondary | ICD-10-CM

## 2011-10-27 DIAGNOSIS — J40 Bronchitis, not specified as acute or chronic: Secondary | ICD-10-CM

## 2011-10-27 DIAGNOSIS — N912 Amenorrhea, unspecified: Secondary | ICD-10-CM

## 2011-10-27 DIAGNOSIS — R1011 Right upper quadrant pain: Secondary | ICD-10-CM

## 2011-10-27 LAB — POCT URINALYSIS DIPSTICK
Bilirubin, UA: NEGATIVE
Glucose, UA: NEGATIVE
Spec Grav, UA: 1.02

## 2011-10-27 MED ORDER — VALSARTAN 80 MG PO TABS: 80.0000 mg | ORAL_TABLET | Freq: Every day | ORAL | Status: DC

## 2011-10-27 MED ORDER — AZITHROMYCIN 250 MG PO TABS: 250.0000 mg | ORAL_TABLET | Freq: Every day | ORAL | Status: AC

## 2011-10-27 NOTE — Patient Instructions (Signed)
We'll notify you of your lab results Start the Diovan daily Take the Azithromycin as directed Add Mucinex to thin your congestion Delsym for cough REST! Hang in there!!!

## 2011-10-27 NOTE — Progress Notes (Signed)
  Subjective:    Patient ID: Kelsey Schmitt, female    DOB: 1976-02-27, 36 y.o.   MRN: [id number]  HPI Hospital f/u- started Lisinopril on 12/17 for elevated BP.  Developed cough which progressed to productive cough- 'coughing up pus, then rust colored'.  Doesn't think she was having fevers.  Went to ER, had normal CXR and was told she 'might have had PNA'.  No abx given.  + nasal congestion.  Still having productive cough.  + PND.  + post tussive emesis.  HTN- stopped the Lisinopril.  Now on HCTZ and Niacin, 'i like the natural stuff'.  Previously on Diovan w/out side effect.  Amenorrhea- has not had period since Oct, reports condom broke during sexual encounter.  Wants checked for HIV.  Abdominal pain- RUQ occurred after violent night of coughing.  Painful to palpation and w/ coughing.  No N/V/D.  Recent WBC in ER was elevated- pt reports it is 'always up'.   Review of Systems For ROS see HPI     Objective:   Physical Exam  Vitals reviewed. Constitutional: She appears well-developed and well-nourished. No distress.  HENT:  Head: Normocephalic and atraumatic.       TMs normal bilaterally Mild nasal congestion Throat w/out erythema, edema, or exudate  Eyes: Conjunctivae and EOM are normal. Pupils are equal, round, and reactive to light.  Neck: Normal range of motion. Neck supple.  Cardiovascular: Normal rate, regular rhythm, normal heart sounds and intact distal pulses.   No murmur heard. Pulmonary/Chest: Effort normal and breath sounds normal. No respiratory distress. She has no wheezes.       + hacking cough  Abdominal: Soft. Bowel sounds are normal. She exhibits no distension. There is tenderness (diffuse mild TTP). There is no rebound and no guarding.  Lymphadenopathy:    She has no cervical adenopathy.          Assessment & Plan:

## 2011-10-28 LAB — CBC WITH DIFFERENTIAL/PLATELET
Basophils Absolute: 0 K/uL (ref 0.0–0.1)
Basophils Relative: 0.5 % (ref 0.0–3.0)
Eosinophils Absolute: 0.2 K/uL (ref 0.0–0.7)
Eosinophils Relative: 3.6 % (ref 0.0–5.0)
HCT: 35.2 % — ABNORMAL LOW (ref 36.0–46.0)
Hemoglobin: 11.7 g/dL — ABNORMAL LOW (ref 12.0–15.0)
Lymphocytes Relative: 54.5 % — ABNORMAL HIGH (ref 12.0–46.0)
Lymphs Abs: 3 K/uL (ref 0.7–4.0)
MCHC: 33.4 g/dL (ref 30.0–36.0)
MCV: 88.8 fl (ref 78.0–100.0)
Monocytes Absolute: 0.5 K/uL (ref 0.1–1.0)
Monocytes Relative: 9 % (ref 3.0–12.0)
Neutro Abs: 1.8 K/uL (ref 1.4–7.7)
Neutrophils Relative %: 32.4 % — ABNORMAL LOW (ref 43.0–77.0)
Platelets: 238 K/uL (ref 150.0–400.0)
RBC: 3.96 Mil/uL (ref 3.87–5.11)
RDW: 15.5 % — ABNORMAL HIGH (ref 11.5–14.6)
WBC: 5.6 K/uL (ref 4.5–10.5)

## 2011-10-28 LAB — HEPATIC FUNCTION PANEL
ALT: 23 U/L (ref 0–35)
AST: 22 U/L (ref 0–37)
Albumin: 3.7 g/dL (ref 3.5–5.2)
Alkaline Phosphatase: 58 U/L (ref 39–117)
Bilirubin, Direct: 0 mg/dL (ref 0.0–0.3)
Total Bilirubin: 0.3 mg/dL (ref 0.3–1.2)
Total Protein: 7.9 g/dL (ref 6.0–8.3)

## 2011-10-28 LAB — BASIC METABOLIC PANEL
BUN: 9 mg/dL (ref 6–23)
CO2: 29 mEq/L (ref 19–32)
Calcium: 8.7 mg/dL (ref 8.4–10.5)
Chloride: 100 mEq/L (ref 96–112)
Creatinine, Ser: 0.7 mg/dL (ref 0.4–1.2)
GFR: 127.7 mL/min (ref >60.00)
Glucose, Bld: 119 mg/dL — ABNORMAL HIGH (ref 70–99)
Potassium: 4.1 mEq/L (ref 3.5–5.1)
Sodium: 136 mEq/L (ref 135–145)

## 2011-10-28 LAB — HIV ANTIBODY (ROUTINE TESTING W REFLEX): HIV: NONREACTIVE

## 2011-10-29 ENCOUNTER — Encounter: Payer: Self-pay | Admitting: *Deleted

## 2011-10-29 LAB — URINE CULTURE

## 2011-11-02 ENCOUNTER — Telehealth: Payer: Self-pay | Admitting: *Deleted

## 2011-11-02 MED ORDER — AMOXICILLIN 500 MG PO CAPS: 500.0000 mg | ORAL_CAPSULE | Freq: Three times a day (TID) | ORAL | Status: AC

## 2011-11-02 MED ORDER — HYDROCODONE-HOMATROPINE 5-1.5 MG/5ML PO SYRP: 5.0000 mL | ORAL_SOLUTION | Freq: Four times a day (QID) | ORAL | Status: AC | PRN

## 2011-11-02 NOTE — Telephone Encounter (Signed)
Pt left VM that she has finished Z-pak and is still c/o nasal/chest congestion, coughing up rust color mucous Pt notes that Z-pak usually does not help but in the pass she has taken amoxicillin and Augmentin. Pt also states that cough is keeping her up at night and mucous is so thick that she often chokes on mucous at night.  Pt uses [facility] [address].

## 2011-11-02 NOTE — Telephone Encounter (Signed)
Spoke to pt, gave instructions on new rx , pt understood, faxed hydromet and sent amox via escribe to pharmacy verifed by pt

## 2011-11-02 NOTE — Telephone Encounter (Signed)
Amoxicillin 500 mg #21; Hydromet 90 cc one teaspoon every 6 hours prn

## 2011-11-04 ENCOUNTER — Encounter (HOSPITAL_BASED_OUTPATIENT_CLINIC_OR_DEPARTMENT_OTHER): Payer: Medicare Other

## 2011-11-11 NOTE — Assessment & Plan Note (Signed)
Pt's BP is again elevated.  Restart meds for better control.  Will continue to follow closely.

## 2011-11-11 NOTE — Assessment & Plan Note (Signed)
Pt w/ hx of PCOS and irregular periods.  Upreg in office (-).  Pt reassured

## 2011-11-11 NOTE — Assessment & Plan Note (Signed)
Given duration of illness, productive cough will start abx.  Cough meds prn.  Reviewed supportive care and red flags that should prompt return.  Pt expressed understanding and is in agreement w/ plan.

## 2011-11-11 NOTE — Assessment & Plan Note (Signed)
Likely musculoskeletal from excessive coughing but will check labs to r/o liver or metabolic issues as well as infxn.  Reviewed supportive care and red flags that should prompt return.  Pt expressed understanding and is in agreement w/ plan.

## 2011-11-16 ENCOUNTER — Telehealth: Payer: Self-pay | Admitting: Emergency Medicine

## 2011-11-16 NOTE — Telephone Encounter (Signed)
Received copies from [facility],on 1.29.13. Forwarded 4 pages to Dr. Delton Coombes ,for review.  sj

## 2011-12-02 ENCOUNTER — Ambulatory Visit (INDEPENDENT_AMBULATORY_CARE_PROVIDER_SITE_OTHER): Payer: Medicare Other | Admitting: Emergency Medicine

## 2011-12-02 ENCOUNTER — Encounter: Payer: Self-pay | Admitting: Emergency Medicine

## 2011-12-02 DIAGNOSIS — G4730 Sleep apnea, unspecified: Secondary | ICD-10-CM

## 2011-12-02 DIAGNOSIS — J45909 Unspecified asthma, uncomplicated: Secondary | ICD-10-CM

## 2011-12-02 DIAGNOSIS — I2789 Other specified pulmonary heart diseases: Secondary | ICD-10-CM

## 2011-12-02 LAB — PULMONARY FUNCTION TEST

## 2011-12-02 NOTE — Assessment & Plan Note (Signed)
No evidence for asthma on Spirometry.  - she can keep SABA available if it helps when she has URI, but she doesn't need scheduled therapy

## 2011-12-02 NOTE — Assessment & Plan Note (Signed)
PSG this Sunday. Will f/u in 1 month to review and plan CPAP

## 2011-12-02 NOTE — Assessment & Plan Note (Signed)
PAP's are normal on her TTE, but I underscored w her that she is high risk to develop PAH if we don't control her OSA. This should be our current focus

## 2011-12-02 NOTE — Progress Notes (Signed)
PFT done today. 

## 2011-12-02 NOTE — Progress Notes (Signed)
  Subjective:    Patient ID: Kelsey Schmitt, female    DOB: [birth year], 36 y.o.   MRN: [id number]  HPI 36 yo obese never smoker, Hx PCOS, OSA with poor CPAP compliance, PAH dx in [place], [place], at [employer] [employer]. Also w hx DM, fibromyalgia, medical non-compliance. Has frequent bronchitis and has been told she might have asthma. Followed now locally by Dr Beverely Low. She is referred for eval of PAH, has been having trouble with some lightheadedness and dizziness for about a year. Has some pre-syncopal symptoms, has never had full syncope. She has occas CP, not necessarily w exertion.   ROV 12/02/11 -- returns for her dyspnea, ? PAH. She has had TTE that shows normal LV fxn and normal R pressures and PAP 12/12. PFT's today showed normal AF, no BD response, possible restriction due to low RV, normal corrected DLCO. She is getting her PSG this Sunday.       Objective:   Physical Exam Gen: Pleasant, morbidly obese, in no distress,  normal affect  ENT: No lesions,  mouth clear,  oropharynx clear, no postnasal drip  Neck: No JVD, no TMG, no carotid bruits  Lungs: No use of accessory muscles, no dullness to percussion, clear without rales or rhonchi  Cardiovascular: RRR, heart sounds normal, no murmur or gallops, no peripheral edema  Musculoskeletal: No deformities, no cyanosis or clubbing  Neuro: alert, non focal  Skin: Warm, no lesions or rashes    Echo 10/03/12: - Left ventricle: The cavity size was normal. Wall thickness was increased in a pattern of mild LVH. Systolic function was normal. The estimated ejection fraction was in the range of 50% to 55%. Wall motion was normal; there were no regional wall motion abnormalities. Left ventricular diastolic function parameters were normal. - Left atrium: The atrium was moderately dilated. - PASP       Assessment & Plan:  ASTHMA No evidence for asthma on Spirometry.  - she can keep SABA available if it helps  when she has URI, but she doesn't need scheduled therapy  PULMONARY HYPERTENSION PAP's are normal on her TTE, but I underscored w her that she is high risk to develop PAH if we don't control her OSA. This should be our current focus  SLEEP APNEA PSG this Sunday. Will f/u in 1 month to review and plan CPAP

## 2011-12-02 NOTE — Patient Instructions (Signed)
Follow up with Dr Delton Coombes in 1 month after your sleep study.

## 2011-12-05 ENCOUNTER — Encounter (HOSPITAL_BASED_OUTPATIENT_CLINIC_OR_DEPARTMENT_OTHER): Payer: Medicare Other

## 2012-01-04 ENCOUNTER — Ambulatory Visit: Payer: Medicare Other | Admitting: Emergency Medicine

## 2012-01-24 ENCOUNTER — Other Ambulatory Visit: Payer: Self-pay | Admitting: *Deleted

## 2012-01-24 ENCOUNTER — Encounter: Payer: Self-pay | Admitting: Family Medicine

## 2012-01-24 ENCOUNTER — Ambulatory Visit (INDEPENDENT_AMBULATORY_CARE_PROVIDER_SITE_OTHER): Payer: Medicare Other | Admitting: Family Medicine

## 2012-01-24 VITALS — BP 137/82 | HR 92 | Temp 98.4°F | Ht 70.0 in | Wt >= 6400 oz

## 2012-01-24 DIAGNOSIS — I10 Essential (primary) hypertension: Secondary | ICD-10-CM

## 2012-01-24 DIAGNOSIS — E785 Hyperlipidemia, unspecified: Secondary | ICD-10-CM

## 2012-01-24 DIAGNOSIS — E119 Type 2 diabetes mellitus without complications: Secondary | ICD-10-CM

## 2012-01-24 LAB — BASIC METABOLIC PANEL
Calcium: 9 mg/dL (ref 8.4–10.5)
Creatinine, Ser: 0.6 mg/dL (ref 0.4–1.2)
GFR: 139.47 mL/min (ref >60.00)
Glucose, Bld: 124 mg/dL — ABNORMAL HIGH (ref 70–99)
Sodium: 139 mEq/L (ref 135–145)

## 2012-01-24 LAB — LIPID PANEL
Cholesterol: 213 mg/dL — ABNORMAL HIGH (ref 0–200)
HDL: 47 mg/dL (ref >39.00)
Triglycerides: 69 mg/dL (ref 0.0–149.0)

## 2012-01-24 LAB — HEPATIC FUNCTION PANEL
Albumin: 3.9 g/dL (ref 3.5–5.2)
Alkaline Phosphatase: 55 U/L (ref 39–117)

## 2012-01-24 MED ORDER — METFORMIN HCL ER 750 MG PO TB24: 750.0000 mg | ORAL_TABLET | Freq: Every day | ORAL | Status: DC

## 2012-01-24 MED ORDER — CARISOPRODOL 350 MG PO TABS: 350.0000 mg | ORAL_TABLET | Freq: Three times a day (TID) | ORAL | Status: DC | PRN

## 2012-01-24 MED ORDER — VALSARTAN 160 MG PO TABS: 160.0000 mg | ORAL_TABLET | Freq: Every day | ORAL | Status: DC

## 2012-01-24 MED ORDER — OMEPRAZOLE 20 MG PO CPDR: 20.0000 mg | DELAYED_RELEASE_CAPSULE | Freq: Every day | ORAL | Status: DC

## 2012-01-24 MED ORDER — ROSUVASTATIN CALCIUM 40 MG PO TABS: 40.0000 mg | ORAL_TABLET | Freq: Every day | ORAL | Status: DC

## 2012-01-24 MED ORDER — HYDROCHLOROTHIAZIDE 12.5 MG PO CAPS: 12.5000 mg | ORAL_CAPSULE | Freq: Every day | ORAL | Status: DC

## 2012-01-24 NOTE — Patient Instructions (Signed)
Follow up in 3 months to recheck weight and sugar We'll notify you of your lab results and make any changes if needed Increase the Diovan to 160mg  (2 pills of what you have, 1 of the new prescription) I'm so proud of your weight loss!  Keep up the good work! Happy Spring!

## 2012-01-24 NOTE — Telephone Encounter (Signed)
Pt left vm stating that her RX for her muscle relaxer needs to be a paper/written rx and wanted to see if she could pick this up today

## 2012-01-24 NOTE — Progress Notes (Signed)
  Subjective:    Patient ID: Kelsey Schmitt, female    DOB: 1976/08/31, 36 y.o.   MRN: [id number]  HPI DM- chronic problem, on Metformin but not taking regularly.  Denies symptomatic lows.  HTN- chronic problem, restarted Diovan at last visit.  BP improved since last visit.  Denies CP, SOB, HAs, visual changes.  Thinks she wants to increase dose.  Hyperlipidemia- chronic problem.  On Crestor.  Due for lab recheck.  Has lost 15 lbs since last visit.  Denies N/V, abd pain, myalgias   Review of Systems For ROS see HPI     Objective:   Physical Exam  Vitals reviewed. Constitutional: She is oriented to person, place, and time. She appears well-developed and well-nourished. No distress.  HENT:  Head: Normocephalic and atraumatic.  Eyes: Conjunctivae and EOM are normal. Pupils are equal, round, and reactive to light.  Neck: Normal range of motion. Neck supple. No thyromegaly present.  Cardiovascular: Normal rate, regular rhythm, normal heart sounds and intact distal pulses.   No murmur heard. Pulmonary/Chest: Effort normal and breath sounds normal. No respiratory distress.  Abdominal: Soft. She exhibits no distension. There is no tenderness.  Musculoskeletal: She exhibits no edema.  Lymphadenopathy:    She has no cervical adenopathy.  Neurological: She is alert and oriented to person, place, and time.  Skin: Skin is warm and dry.  Psychiatric: She has a normal mood and affect. Her behavior is normal.          Assessment & Plan:

## 2012-01-24 NOTE — Telephone Encounter (Signed)
Placed signed RX at front desk for pick up, left vm on pt home line to advise that her rx is ready for pick up

## 2012-01-24 NOTE — Telephone Encounter (Signed)
Printed and signed.  

## 2012-01-27 LAB — HEMOGLOBIN A1C: Hgb A1c MFr Bld: 8.2 % — ABNORMAL HIGH (ref 4.6–6.5)

## 2012-01-30 NOTE — Assessment & Plan Note (Signed)
Chronic problem.  Pt admits to infrequent use of metformin.  Due for labs.  Adjust meds prn based on results.  Encouraged her to take meds regularly and follow and ADA diet.

## 2012-01-30 NOTE — Assessment & Plan Note (Signed)
Chronic problem.  Currently asymptomatic but BP is not ideal.  Increase diovan to 160mg .  Will continue to follow closely.

## 2012-01-30 NOTE — Assessment & Plan Note (Signed)
Chronic problem.  Has lost 15 lbs.  Tolerating Crestor w/out difficulty.  Check labs.  Adjust meds prn.

## 2012-01-31 MED ORDER — ATORVASTATIN CALCIUM 40 MG PO TABS: 40.0000 mg | ORAL_TABLET | Freq: Every day | ORAL | Status: DC

## 2012-01-31 MED ORDER — SITAGLIPTIN PHOS-METFORMIN HCL 50-1000 MG PO TABS: 1.0000 | ORAL_TABLET | Freq: Two times a day (BID) | ORAL | Status: DC

## 2012-01-31 NOTE — Progress Notes (Signed)
Addended by: Derry Lory A on: 01/31/2012 04:53 PM   Modules accepted: Orders

## 2012-03-31 ENCOUNTER — Inpatient Hospital Stay (HOSPITAL_COMMUNITY)
Admission: AD | Admit: 2012-03-31 | Discharge: 2012-03-31 | Disposition: A | Discharge status: Home / Self Care | Payer: Medicare Other | Source: Ambulatory Visit | Attending: Obstetrics & Gynecology | Admitting: Obstetrics & Gynecology

## 2012-03-31 ENCOUNTER — Encounter (HOSPITAL_COMMUNITY): Payer: Self-pay | Admitting: *Deleted

## 2012-03-31 DIAGNOSIS — R300 Dysuria: Secondary | ICD-10-CM | POA: Insufficient documentation

## 2012-03-31 DIAGNOSIS — L988 Other specified disorders of the skin and subcutaneous tissue: Secondary | ICD-10-CM

## 2012-03-31 DIAGNOSIS — R238 Other skin changes: Secondary | ICD-10-CM

## 2012-03-31 DIAGNOSIS — N949 Unspecified condition associated with female genital organs and menstrual cycle: Secondary | ICD-10-CM | POA: Insufficient documentation

## 2012-03-31 DIAGNOSIS — N898 Other specified noninflammatory disorders of vagina: Secondary | ICD-10-CM | POA: Insufficient documentation

## 2012-03-31 LAB — URINALYSIS, ROUTINE W REFLEX MICROSCOPIC
Bilirubin Urine: NEGATIVE
Protein, ur: NEGATIVE mg/dL
Specific Gravity, Urine: 1.025 (ref 1.005–1.030)
Urobilinogen, UA: 0.2 mg/dL (ref 0.0–1.0)

## 2012-03-31 LAB — WET PREP, GENITAL: Clue Cells Wet Prep HPF POC: NONE SEEN

## 2012-03-31 LAB — URINE MICROSCOPIC-ADD ON

## 2012-03-31 MED ORDER — VALACYCLOVIR HCL 1 G PO TABS: 1000.0000 mg | ORAL_TABLET | Freq: Two times a day (BID) | ORAL | Status: AC

## 2012-03-31 MED ORDER — FLUCONAZOLE 150 MG PO TABS
150.0000 mg | ORAL_TABLET | Freq: Once | ORAL | Status: AC
  Administered 2012-03-31: 150 mg via ORAL
  Filled 2012-03-31: qty 1

## 2012-03-31 NOTE — MAU Note (Signed)
Hope Neese RNP at bedside to discuss plan of care, medications and cultures.

## 2012-03-31 NOTE — Discharge Instructions (Signed)
FOLLOW UP WITH YOUR DOCTOR IN [place]. RETURN HERE AS NEEDED.

## 2012-03-31 NOTE — MAU Note (Signed)
Pt states has had uti s/s. After sexual encounter, accidentally used dial soap on her vaginal area. Feels like her urethra is inflammed. Did have itching, now none and vaginal discharge is clear. Now feels like "peeing razor blades". LMP 3 months ago, hx PCOS. Also notes r sided lower abdominal pain. All s/s noted a little over 1 week.

## 2012-03-31 NOTE — MAU Provider Note (Signed)
History     CSN: [id number]  Arrival date & time 03/31/12  1410   None     Chief Complaint  Patient presents with  . Burning with urination    HPI Kelsey Schmitt is a 36 y.o. female who presents to MAU for vaginal pain and burning on urination. The symptoms started about a week ago. The pain is when the urine hits the irritated area in the vaginal area. LMP 2 months ago, history of PCOS. Last pap smear one year ago and was normal. Regular GYN Care in [facility]. History of Chlamydia that was treated. Current sex partner x 2 months. The history was provided by the patient.  Past Medical History  Diagnosis Date  . Asthma   . Arthritis   . Diabetes mellitus   . GERD (gastroesophageal reflux disease)   . Gout   . Pulmonary hypertension   . Allergy   . Head injury 2002  . PCOS (polycystic ovarian syndrome)   . Fibromyalgia   . Obesity     Past Surgical History  Procedure Date  . No past surgeries     Family History  Problem Relation Age of Onset  . Hypertension Mother   . Diabetes Brother   . Hypertension Brother   . Lung cancer Maternal Uncle   . Breast cancer Maternal Aunt   . Heart disease Mother   . Heart disease Father   . Heart disease Paternal Grandfather   . Rheum arthritis Mother     History  Substance Use Topics  . Smoking status: Never Smoker   . Smokeless tobacco: Not on file  . Alcohol Use: No    OB History    Grav Para Term Preterm Abortions TAB SAB Ect Mult Living   0               Review of Systems  Constitutional: Negative for fever, chills, diaphoresis, fatigue and unexpected weight change.  HENT: Negative for ear pain, congestion, sore throat, facial swelling, neck pain, neck stiffness, dental problem and sinus pressure.   Eyes: Negative for photophobia, pain, discharge and visual disturbance.  Respiratory: Negative for cough, chest tightness and wheezing.   Cardiovascular: Negative for chest pain and palpitations.  Gastrointestinal:  Negative for nausea, vomiting, abdominal pain, diarrhea, constipation and abdominal distention.  Genitourinary: Positive for vaginal pain and menstrual problem. Negative for dysuria, urgency, frequency, flank pain, vaginal bleeding, vaginal discharge, difficulty urinating and pelvic pain.  Musculoskeletal: Positive for back pain. Negative for myalgias and gait problem.  Skin: Negative for color change and rash.  Neurological: Negative for dizziness, speech difficulty, weakness, light-headedness, numbness and headaches.  Psychiatric/Behavioral: Negative for confusion and agitation. The patient is not nervous/anxious.     Allergies  Cymbalta; Ibuprofen; Lisinopril; and Naproxen  Home Medications  No current outpatient prescriptions on file.  BP 139/69  Pulse 83  Temp 98.5 F (36.9 C) (Oral)  Resp 16  Ht 5\' 10"  (1.778 m)  Wt 417 lb 8 oz (189.377 kg)  BMI 59.91 kg/m2  SpO2 100%  LMP 01/21/2012  Physical Exam  Nursing note and vitals reviewed. Constitutional: She is oriented to person, place, and time.       Morbidly obese  HENT:  Head: Normocephalic.  Eyes: EOM are normal.  Neck: Neck supple.  Cardiovascular: Normal rate.   Pulmonary/Chest: Effort normal.  Abdominal: Soft. There is no tenderness.       obese  Genitourinary:  External genitalia with vesicular lesions noted.  Thick white cheesy discharge vaginal vault.    Musculoskeletal: Normal range of motion.  Neurological: She is alert and oriented to person, place, and time. No cranial nerve deficit.  Skin: Skin is warm and dry.  Psychiatric: She has a normal mood and affect. Her behavior is normal. Judgment and thought content normal.   Results for orders placed during the hospital encounter of 03/31/12 (from the past 24 hour(s))  URINALYSIS, ROUTINE W REFLEX MICROSCOPIC     Status: Abnormal   Collection Time   03/31/12  3:33 PM      Component Value Range   Color, Urine YELLOW  YELLOW   APPearance CLEAR   CLEAR   Specific Gravity, Urine 1.025  1.005 - 1.030   pH 6.0  5.0 - 8.0   Glucose, UA NEGATIVE  NEGATIVE mg/dL   Hgb urine dipstick TRACE (*) NEGATIVE   Bilirubin Urine NEGATIVE  NEGATIVE   Ketones, ur NEGATIVE  NEGATIVE mg/dL   Protein, ur NEGATIVE  NEGATIVE mg/dL   Urobilinogen, UA 0.2  0.0 - 1.0 mg/dL   Nitrite NEGATIVE  NEGATIVE   Leukocytes, UA TRACE (*) NEGATIVE  URINE MICROSCOPIC-ADD ON     Status: Abnormal   Collection Time   03/31/12  3:33 PM      Component Value Range   Squamous Epithelial / LPF RARE  RARE   WBC, UA 3-6  <3 WBC/hpf   RBC / HPF 3-6  <3 RBC/hpf   Bacteria, UA FEW (*) RARE  POCT PREGNANCY, URINE     Status: Normal   Collection Time   03/31/12  3:59 PM      Component Value Range   Preg Test, Ur NEGATIVE  NEGATIVE  WET PREP, GENITAL     Status: Abnormal   Collection Time   03/31/12  4:30 PM      Component Value Range   Yeast Wet Prep HPF POC NONE SEEN  NONE SEEN   Trich, Wet Prep NONE SEEN  NONE SEEN   Clue Cells Wet Prep HPF POC NONE SEEN  NONE SEEN   WBC, Wet Prep HPF POC FEW (*) NONE SEEN   Assessment: Genital lesions   Vaginal pain   Dysuria   Vaginal discharge  Diff. Dx: HSV   Monilia   Vaginal irritation  Plan:  Diflucan 150 mg PO now   Valtrex Rx   GC, Chlamydia, urine and HSV cultures pending   Follow up with GYN in [facility], return here as needed    ED Course  Procedures    MDM

## 2012-04-01 LAB — URINE CULTURE: Special Requests: NORMAL

## 2012-04-01 LAB — GC/CHLAMYDIA PROBE AMP, GENITAL: GC Probe Amp, Genital: NEGATIVE

## 2012-04-03 LAB — HERPES SIMPLEX VIRUS CULTURE
Culture: DETECTED
Special Requests: NORMAL

## 2012-04-04 ENCOUNTER — Telehealth (HOSPITAL_COMMUNITY): Payer: Self-pay | Admitting: Nurse Practitioner

## 2012-04-04 NOTE — Telephone Encounter (Signed)
Patient called to check on her lab results, verified social security number.  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  Patient had not filled her Valtrex Rx yet, but will do so now after receiving result.

## 2012-05-09 ENCOUNTER — Encounter (HOSPITAL_COMMUNITY): Payer: Self-pay | Admitting: Emergency Medicine

## 2012-05-09 ENCOUNTER — Emergency Department (HOSPITAL_COMMUNITY)
Admission: EM | Admit: 2012-05-09 | Discharge: 2012-05-09 | Disposition: A | Discharge status: Home / Self Care | Payer: Medicare Other | Attending: Emergency Medicine | Admitting: Emergency Medicine

## 2012-05-09 DIAGNOSIS — Z79899 Other long term (current) drug therapy: Secondary | ICD-10-CM | POA: Insufficient documentation

## 2012-05-09 DIAGNOSIS — R109 Unspecified abdominal pain: Secondary | ICD-10-CM

## 2012-05-09 DIAGNOSIS — R1011 Right upper quadrant pain: Secondary | ICD-10-CM | POA: Insufficient documentation

## 2012-05-09 DIAGNOSIS — E119 Type 2 diabetes mellitus without complications: Secondary | ICD-10-CM | POA: Insufficient documentation

## 2012-05-09 DIAGNOSIS — IMO0001 Reserved for inherently not codable concepts without codable children: Secondary | ICD-10-CM | POA: Insufficient documentation

## 2012-05-09 DIAGNOSIS — J45909 Unspecified asthma, uncomplicated: Secondary | ICD-10-CM | POA: Insufficient documentation

## 2012-05-09 DIAGNOSIS — K219 Gastro-esophageal reflux disease without esophagitis: Secondary | ICD-10-CM | POA: Insufficient documentation

## 2012-05-09 DIAGNOSIS — K279 Peptic ulcer, site unspecified, unspecified as acute or chronic, without hemorrhage or perforation: Secondary | ICD-10-CM

## 2012-05-09 DIAGNOSIS — R1013 Epigastric pain: Secondary | ICD-10-CM | POA: Insufficient documentation

## 2012-05-09 DIAGNOSIS — M109 Gout, unspecified: Secondary | ICD-10-CM | POA: Insufficient documentation

## 2012-05-09 LAB — CBC WITH DIFFERENTIAL/PLATELET
Hemoglobin: 11.7 g/dL — ABNORMAL LOW (ref 12.0–15.0)
Lymphocytes Relative: 29 % (ref 12–46)
Lymphs Abs: 2.9 10*3/uL (ref 0.7–4.0)
Monocytes Relative: 5 % (ref 3–12)
Neutro Abs: 6.5 10*3/uL (ref 1.7–7.7)
Neutrophils Relative %: 65 % (ref 43–77)
RBC: 3.92 MIL/uL (ref 3.87–5.11)
WBC: 10 10*3/uL (ref 4.0–10.5)

## 2012-05-09 LAB — COMPREHENSIVE METABOLIC PANEL
Alkaline Phosphatase: 59 U/L (ref 39–117)
BUN: 6 mg/dL (ref 6–23)
Chloride: 100 mEq/L (ref 96–112)
GFR calc Af Amer: >90 mL/min (ref >90)
Glucose, Bld: 132 mg/dL — ABNORMAL HIGH (ref 70–99)
Potassium: 3.9 mEq/L (ref 3.5–5.1)
Total Bilirubin: 0.1 mg/dL — ABNORMAL LOW (ref 0.3–1.2)

## 2012-05-09 LAB — PREGNANCY, URINE: Preg Test, Ur: NEGATIVE

## 2012-05-09 LAB — LIPASE, BLOOD: Lipase: 19 U/L (ref 11–59)

## 2012-05-09 MED ORDER — ONDANSETRON HCL 4 MG/2ML IJ SOLN: 4.0000 mg | Freq: Once | INTRAMUSCULAR | Status: DC

## 2012-05-09 MED ORDER — DICYCLOMINE HCL 20 MG PO TABS: 20.0000 mg | ORAL_TABLET | Freq: Two times a day (BID) | ORAL | Status: DC

## 2012-05-09 MED ORDER — GI COCKTAIL ~~LOC~~
30.0000 mL | Freq: Once | ORAL | Status: AC
  Administered 2012-05-09: 30 mL via ORAL
  Filled 2012-05-09: qty 30

## 2012-05-09 MED ORDER — SUCRALFATE 1 G PO TABS
1.0000 g | ORAL_TABLET | Freq: Three times a day (TID) | ORAL | Status: DC
  Administered 2012-05-09: 1 g via ORAL
  Filled 2012-05-09 (×3): qty 1

## 2012-05-09 MED ORDER — SUCRALFATE 1 G PO TABS: 1.0000 g | ORAL_TABLET | Freq: Four times a day (QID) | ORAL | Status: DC

## 2012-05-09 MED ORDER — ONDANSETRON 8 MG PO TBDP
8.0000 mg | ORAL_TABLET | Freq: Once | ORAL | Status: AC
  Administered 2012-05-09: 8 mg via ORAL
  Filled 2012-05-09: qty 1

## 2012-05-09 NOTE — ED Notes (Signed)
Pt c/o abdominal pain and stools are black. States "feels like someone is clawing the inside." Pt c/o nausea, near vomiting, dizziness, weakness, diaphoresis. Pt states there is a possibility of pregnancy. Pt states pressure in lower abdomen.

## 2012-05-09 NOTE — ED Provider Notes (Signed)
History     CSN: [id number]  Arrival date & time 05/09/12  1556   First MD Initiated Contact with Patient 05/09/12 1629      Chief Complaint  Patient presents with  . Abdominal Pain    (Consider location/radiation/quality/duration/timing/severity/associated sxs/prior treatment) The history is provided by the patient.   issue here with abdominal pain described as burning that gets worse when she does need them better when she does eat that is localized to her epigastric area and right upper quadrant. Some dark stools noted without vomiting. No fever. Has been using PPIs without relief. Denies any vaginal bleeding or discharge although she states she might be pregnant. Notes some dizziness when she stands but denies any weakness or diaphoresis. No status she is fasting for Ramadan at this time  Past Medical History  Diagnosis Date  . Asthma   . Arthritis   . Diabetes mellitus   . GERD (gastroesophageal reflux disease)   . Gout   . Pulmonary hypertension   . Allergy   . Head injury 2002  . PCOS (polycystic ovarian syndrome)   . Fibromyalgia   . Obesity     Past Surgical History  Procedure Date  . No past surgeries     Family History  Problem Relation Age of Onset  . Hypertension Mother   . Heart disease Mother   . Rheum arthritis Mother   . Diabetes Brother   . Hypertension Brother   . Lung cancer Maternal Uncle   . Breast cancer Maternal Aunt   . Heart disease Father   . Hypertension Father   . Heart disease Paternal Grandfather     History  Substance Use Topics  . Smoking status: Never Smoker   . Smokeless tobacco: Not on file  . Alcohol Use: No    OB History    Grav Para Term Preterm Abortions TAB SAB Ect Mult Living   0               Review of Systems  All other systems reviewed and are negative.    Allergies  Cymbalta; Ibuprofen; Lisinopril; and Naproxen  Home Medications   Current Outpatient Rx  Name Route Sig Dispense Refill  .  CARISOPRODOL 350 MG PO TABS Oral Take 1 tablet (350 mg total) by mouth 3 (three) times daily as needed. For pain 90 tablet 3  . DICLOFENAC SODIUM 1 % TD GEL Topical Apply 1 application topically daily as needed. For bone and joint pain    . HYDROCODONE-ACETAMINOPHEN 7.5-500 MG PO TABS Oral Take 1 tablet by mouth every 6 (six) hours as needed. For pain     . IBUPROFEN 200 MG PO TABS Oral Take 400 mg by mouth every 6 (six) hours as needed. For pain    . OMEPRAZOLE 20 MG PO CPDR Oral Take 1 capsule (20 mg total) by mouth daily. 30 capsule 2  . VALSARTAN 160 MG PO TABS Oral Take 1 tablet (160 mg total) by mouth daily. 30 tablet 3  . METFORMIN HCL ER 750 MG PO TB24 Oral Take 1 tablet (750 mg total) by mouth daily with breakfast. 30 tablet 6    BP 153/102  Pulse 101  Temp 98 F (36.7 C) (Oral)  Resp 20  SpO2 100%  Physical Exam  Nursing note and vitals reviewed. Constitutional: She is oriented to person, place, and time. She appears well-developed and well-nourished.  Non-toxic appearance. No distress.  HENT:  Head: Normocephalic and atraumatic.  Eyes: Conjunctivae,  EOM and lids are normal. Pupils are equal, round, and reactive to light.  Neck: Normal range of motion. Neck supple. No tracheal deviation present. No mass present.  Cardiovascular: Normal rate, regular rhythm and normal heart sounds.  Exam reveals no gallop.   No murmur heard. Pulmonary/Chest: Effort normal and breath sounds normal. No stridor. No respiratory distress. She has no decreased breath sounds. She has no wheezes. She has no rhonchi. She has no rales.  Abdominal: Soft. Normal appearance and bowel sounds are normal. She exhibits no distension. There is no tenderness. There is no rebound and no CVA tenderness.  Musculoskeletal: Normal range of motion. She exhibits no edema and no tenderness.  Neurological: She is alert and oriented to person, place, and time. She has normal strength. No cranial nerve deficit or sensory  deficit. GCS eye subscore is 4. GCS verbal subscore is 5. GCS motor subscore is 6.  Skin: Skin is warm and dry. No abrasion and no rash noted.  Psychiatric: She has a normal mood and affect. Her speech is normal and behavior is normal.    ED Course  Procedures (including critical care time)  Labs Reviewed  CBC WITH DIFFERENTIAL - Abnormal; Notable for the following:    Hemoglobin 11.7 (*)     HCT 34.6 (*)     All other components within normal limits  PREGNANCY, URINE  COMPREHENSIVE METABOLIC PANEL  LIPASE, BLOOD   No results found.   No diagnosis found.    MDM  Rectal exam deferred per pt. Hb stable, suspect pud, pt restarted on her ppi, feels better after gi cocktail--will f/u with her pcp, return instructions given        Toy Baker, MD 05/09/12 1831

## 2012-05-09 NOTE — ED Notes (Signed)
Pt states hx of abscess on lower abdomen and states there is a new one formed .

## 2012-05-15 ENCOUNTER — Other Ambulatory Visit: Payer: Self-pay | Admitting: Family Medicine

## 2012-05-15 DIAGNOSIS — I10 Essential (primary) hypertension: Secondary | ICD-10-CM

## 2012-05-15 NOTE — Telephone Encounter (Signed)
refill soma 350mg  tablets #90, take 1-tablet by mouth three times daily as needed for pain last fill 6.28.13, last ov 4.8.13-dm f/u

## 2012-05-16 NOTE — Telephone Encounter (Signed)
Ok for [employer], #90 w/ 3 refills Ok for Diovan, #30, 6 refills

## 2012-05-16 NOTE — Telephone Encounter (Signed)
Refill: Diovan 160mg  tab. Take 1 tablet by mouth every day. Qty 30. Last fill 04-14-12

## 2012-05-16 NOTE — Telephone Encounter (Signed)
Last OV and refill noted for [facility] 01-24-12 #90 with 3 refills noted TID

## 2012-05-17 MED ORDER — VALSARTAN 160 MG PO TABS: 160.0000 mg | ORAL_TABLET | Freq: Every day | ORAL | Status: DC

## 2012-05-17 MED ORDER — CARISOPRODOL 350 MG PO TABS: 350.0000 mg | ORAL_TABLET | Freq: Three times a day (TID) | ORAL | Status: DC | PRN

## 2012-05-17 NOTE — Telephone Encounter (Signed)
.  rx faxed to pharmacy, manually for Soma and escribe Diovan

## 2012-05-23 ENCOUNTER — Encounter: Payer: Self-pay | Admitting: Family Medicine

## 2012-05-23 ENCOUNTER — Ambulatory Visit (INDEPENDENT_AMBULATORY_CARE_PROVIDER_SITE_OTHER): Payer: Medicare Other | Admitting: Family Medicine

## 2012-05-23 VITALS — BP 139/84 | HR 89 | Temp 98.5°F | Ht 70.5 in | Wt >= 6400 oz

## 2012-05-23 DIAGNOSIS — R1013 Epigastric pain: Secondary | ICD-10-CM

## 2012-05-23 DIAGNOSIS — I10 Essential (primary) hypertension: Secondary | ICD-10-CM

## 2012-05-23 DIAGNOSIS — E119 Type 2 diabetes mellitus without complications: Secondary | ICD-10-CM

## 2012-05-23 LAB — BASIC METABOLIC PANEL
BUN: 6 mg/dL (ref 6–23)
CO2: 27 mEq/L (ref 19–32)
Calcium: 8.6 mg/dL (ref 8.4–10.5)
Creatinine, Ser: 0.6 mg/dL (ref 0.4–1.2)

## 2012-05-23 LAB — H. PYLORI ANTIBODY, IGG: H Pylori IgG: NEGATIVE

## 2012-05-23 LAB — HEMOGLOBIN A1C: Hgb A1c MFr Bld: 7 % — ABNORMAL HIGH (ref 4.6–6.5)

## 2012-05-23 MED ORDER — SUCRALFATE 1 G PO TABS: 1.0000 g | ORAL_TABLET | Freq: Four times a day (QID) | ORAL | Status: DC

## 2012-05-23 MED ORDER — DICYCLOMINE HCL 20 MG PO TABS: 20.0000 mg | ORAL_TABLET | Freq: Two times a day (BID) | ORAL | Status: DC

## 2012-05-23 MED ORDER — CARISOPRODOL 350 MG PO TABS: 350.0000 mg | ORAL_TABLET | Freq: Three times a day (TID) | ORAL | Status: DC | PRN

## 2012-05-23 MED ORDER — VALSARTAN 320 MG PO TABS: 320.0000 mg | ORAL_TABLET | Freq: Every day | ORAL | Status: DC

## 2012-05-23 MED ORDER — OMEPRAZOLE 40 MG PO CPDR: 40.0000 mg | DELAYED_RELEASE_CAPSULE | Freq: Every day | ORAL | Status: DC

## 2012-05-23 NOTE — Progress Notes (Signed)
  Subjective:    Patient ID: Kelsey Schmitt, female    DOB: 02-07-1976, 36 y.o.   MRN: [id number]  HPI ER F/U- seen on 7/23 for abd pain.  Epigastric.  + nausea.  This occurred during Ramadan fast.  Pain described as sharp pain.  Pain improved w/ GI cocktail.  Was prescribed bentyl and sucralfate.  Pain is improving w/ meds but 'it's really bad'.  Has never seen GI doctor.  Pain improves w/ eating.  ER suspects duodenal ulcer.  DM- chronic problem.  Seen in April and was told to start Janumet for poorly controlled DM.  Never started Janumet.  Not checking sugars.  On Metformin but 'i don't take it regularly'.  HTN- chronic problem.  elevated today.  Denies CP, SOB, HAs, visual changes, edema.  C/o hearing heartbeat in ears   Review of Systems For ROS see HPI     Objective:   Physical Exam  Vitals reviewed. Constitutional: She is oriented to person, place, and time. She appears well-developed and well-nourished. No distress.       Morbidly obese  HENT:  Head: Normocephalic and atraumatic.  Eyes: Conjunctivae and EOM are normal. Pupils are equal, round, and reactive to light.  Neck: Normal range of motion. Neck supple. No thyromegaly present.  Cardiovascular: Normal rate, regular rhythm, normal heart sounds and intact distal pulses.   No murmur heard. Pulmonary/Chest: Effort normal and breath sounds normal. No respiratory distress.  Abdominal: Soft. She exhibits no distension. There is no tenderness. There is no rebound and no guarding.  Musculoskeletal: She exhibits edema (1+ non pitting edema bilaterally).  Lymphadenopathy:    She has no cervical adenopathy.  Neurological: She is alert and oriented to person, place, and time.  Skin: Skin is warm and dry.  Psychiatric: She has a normal mood and affect. Her behavior is normal.          Assessment & Plan:

## 2012-05-23 NOTE — Patient Instructions (Addendum)
Follow up in 1 month to recheck BP Start the Diovan 320mg - 2 tabs of what you have at home, 1 of the new script Increase to Omeprazole 40mg  daily- 2 of what you have at home, 1 of the new script We'll notify you of your lab results Someone will call you with your GI appt Call with any questions or concerns Hang in there!!!

## 2012-05-23 NOTE — Assessment & Plan Note (Signed)
Deteriorated.  Pt feels this is in direct relation to abdominal pain.  Increase Diovan to 320mg .  Discussed starting HCTZ given LE edema but this causes problems w/ pt's gout.  Will follow closely.

## 2012-05-23 NOTE — Assessment & Plan Note (Signed)
New.  Agree w/ presumptive ER dx of duodenal ulcer based on symptomatology.  Check H pylori.  Increase PPI to 40mg  daily.  Continue carafate.  Refer to GI.  Reviewed supportive care and red flags that should prompt return.  Pt expressed understanding and is in agreement w/ plan.

## 2012-05-23 NOTE — Assessment & Plan Note (Signed)
Chronic problem.  Pt did not pick up Janumet as prescribed at last visit and admits to poor compliance w/ Metformin.  Check labs.  Adjust doses prn.  Stressed importance of compliance and attention to healthy diet.  Will follow closely.

## 2012-05-24 ENCOUNTER — Encounter: Payer: Self-pay | Admitting: *Deleted

## 2012-05-31 ENCOUNTER — Telehealth: Payer: Self-pay | Admitting: *Deleted

## 2012-05-31 MED ORDER — ONDANSETRON HCL 4 MG PO TABS: ORAL_TABLET | ORAL | Status: DC

## 2012-05-31 NOTE — Telephone Encounter (Signed)
rx sent to pharmacy by e-script  

## 2012-05-31 NOTE — Telephone Encounter (Signed)
Ok for Zofran 8mg , 1/2-1 tab TID prn #20, no refills

## 2012-05-31 NOTE — Telephone Encounter (Signed)
Pt left vm to advise that the prilosec is not really helping with the ulcer she was diagnosed with and notes nausea, pt advised she was given zofran in the hospital and this worked for her nausea, pt wanted to see if this medication could be sent to [facility]

## 2012-06-21 ENCOUNTER — Ambulatory Visit: Payer: Medicare Other | Admitting: Internal Medicine

## 2012-06-23 ENCOUNTER — Ambulatory Visit: Payer: Medicare Other | Admitting: Family Medicine

## 2012-07-10 ENCOUNTER — Ambulatory Visit: Payer: Medicare Other | Admitting: Internal Medicine

## 2012-07-28 ENCOUNTER — Encounter: Payer: Self-pay | Admitting: Internal Medicine

## 2012-07-28 ENCOUNTER — Ambulatory Visit (INDEPENDENT_AMBULATORY_CARE_PROVIDER_SITE_OTHER): Payer: Medicare Other | Admitting: Internal Medicine

## 2012-07-28 VITALS — BP 112/84 | HR 86 | Ht 70.5 in | Wt >= 6400 oz

## 2012-07-28 DIAGNOSIS — R1013 Epigastric pain: Secondary | ICD-10-CM

## 2012-07-28 NOTE — Patient Instructions (Addendum)
You have been scheduled for an abdominal ultrasound at [facility] (1st floor of hospital) on 08-07-12 at 9:00am. Please arrive 15 minutes prior to your appointment for registration. Make certain not to have anything to eat or drink after midnight prior to your appointment. Should you need to reschedule your appointment, please contact radiology at [phone].

## 2012-07-28 NOTE — Progress Notes (Signed)
HISTORY OF PRESENT ILLNESS:  Kelsey Schmitt is a 36 y.o. female with asthma, arthritis, diabetes mellitus, GERD, morbid obesity, pulmonary hypertension, and gout. She presents today regarding abdominal pain. Patient reports that she had abdominal pain in July after fasting for religious purposes. She was seen in the emergency room. She was given the tentative diagnosis of ulcer disease. She was placed on PPI. Testing for Helicobacter pylori was negative. Subsequently Carafate. Severe pain resolved. She's also had some lower bowel discomfort that is relieved with urination and defecation. No bleeding. No weight loss. No nausea or vomiting. Currently, she reports fleeting epigastric pain. She describes it as sharp and lasted about 1 second. It can happen a few times per day. No exacerbating or relieving factors. She's also undergone recent GYN assessments. She does take NSAIDs periodically for her arthritisprevious laboratories from July and August reveal normal comprehensive metabolic panel except for elevated glucose and mildly depressed albumin. CBC normal except for hemoglobin of 11.7. Normal MCV.  REVIEW OF SYSTEMS:  All non-GI ROS negative except for arthritis, back pain, cough, allergies, sleeping problems, shortness of breath with exercise, chronic pain syndrome  Past Medical History  Diagnosis Date  . Asthma   . Arthritis   . Diabetes mellitus     uses natural ERBS  . GERD (gastroesophageal reflux disease)   . Gout   . Pulmonary hypertension   . Allergy   . Head injury 2002  . PCOS (polycystic ovarian syndrome)   . Fibromyalgia   . Obesity   . Unspecified essential hypertension   . Arthritis, rheumatoid     Past Surgical History  Procedure Date  . No past surgeries     Social History Kelsey Schmitt  reports that she has never smoked. She has never used smokeless tobacco. She reports that she does not drink alcohol or use illicit drugs.  family history includes Breast cancer  in her maternal aunt; Diabetes in her brother; Heart disease in her father, mother, and paternal grandfather; Hypertension in her brother, father, and mother; Lung cancer in her maternal uncle; and Rheum arthritis in her mother.  Allergies  Allergen Reactions  . Cymbalta (Duloxetine Hcl) Nausea And Vomiting, Swelling and Other (See Comments)    Difficulty sleeping  . Ibuprofen     Stomach ache   . Lisinopril Cough  . Lyrica (Pregabalin)     Chest pain  . Naproxen     REACTION: GI upset       PHYSICAL EXAMINATION: Vital signs: BP 112/84  Pulse 86  Ht 5' 10.5" (1.791 m)  Wt 409 lb 9.6 oz (185.793 kg)  BMI 57.94 kg/m2  SpO2 98%  LMP 06/26/2012  Constitutional: morbidly obese,generally well-appearing, no acute distress Psychiatric: alert and oriented x3, cooperative Eyes: extraocular movements intact, anicteric, conjunctiva pink Mouth: oral pharynx moist, no lesions Neck: supple no lymphadenopathy Cardiovascular: heart regular rate and rhythm, no murmur Lungs: clear to auscultation bilaterally Abdomen: soft,markedly obese, nontender, nondistended, no obvious ascites, no peritoneal signs, normal bowel sounds, no organomegaly Extremities: no lower extremity edema bilaterally Skin: no lesions on visible extremities Neuro: No focal deficits.   ASSESSMENT:  #1. Previous problems with more acute abdominal pain. Possibly related to ulcer disease. Rule out gallbladder disease. Currently better on PPI #2. Current fleeting pains as described. Not worrisome. Likely spasm or musculoskeletal #3. Multiple medical problems   PLAN:  #1. Continue PPI.  #2. Minimize NSAIDs #3. Weight loss #4. Abdominal ultrasound with attention to the gallbladder. #5. GI followup  when necessary

## 2012-08-02 ENCOUNTER — Other Ambulatory Visit: Payer: Self-pay | Admitting: Family Medicine

## 2012-08-02 DIAGNOSIS — I10 Essential (primary) hypertension: Secondary | ICD-10-CM

## 2012-08-02 NOTE — Telephone Encounter (Signed)
refill diovan 320mg  tablets TK1 T PO QD --last fill 8.22.13 #30--last ov 8.6.13 Hospt. F/U

## 2012-08-03 MED ORDER — VALSARTAN 320 MG PO TABS: 320.0000 mg | ORAL_TABLET | Freq: Every day | ORAL | Status: DC

## 2012-08-03 NOTE — Telephone Encounter (Signed)
Rx sent.    MW 

## 2012-08-07 ENCOUNTER — Ambulatory Visit (HOSPITAL_COMMUNITY)
Admission: RE | Admit: 2012-08-07 | Discharge: 2012-08-07 | Disposition: A | Discharge status: Home / Self Care | Payer: Medicare Other | Source: Ambulatory Visit | Attending: Internal Medicine | Admitting: Internal Medicine

## 2012-08-07 DIAGNOSIS — R1013 Epigastric pain: Secondary | ICD-10-CM

## 2012-08-22 ENCOUNTER — Telehealth: Payer: Self-pay | Admitting: Family Medicine

## 2012-08-22 DIAGNOSIS — R1013 Epigastric pain: Secondary | ICD-10-CM

## 2012-08-22 MED ORDER — OMEPRAZOLE 40 MG PO CPDR: 40.0000 mg | DELAYED_RELEASE_CAPSULE | Freq: Every day | ORAL | Status: DC

## 2012-08-22 NOTE — Telephone Encounter (Signed)
Refill: Omeprazole 40 mg capsules. Take one capsule by mouth every day. Qty 30. Last fill 06-23-12

## 2012-08-22 NOTE — Telephone Encounter (Signed)
Please send to [facility] assistant

## 2012-08-22 NOTE — Telephone Encounter (Signed)
Rx filled

## 2012-08-22 NOTE — Telephone Encounter (Signed)
Ok for #30, 6 refills 

## 2012-09-12 ENCOUNTER — Other Ambulatory Visit: Payer: Self-pay | Admitting: Family Medicine

## 2012-09-12 MED ORDER — CARISOPRODOL 350 MG PO TABS: 350.0000 mg | ORAL_TABLET | Freq: Three times a day (TID) | ORAL | Status: DC | PRN

## 2012-09-12 NOTE — Telephone Encounter (Signed)
refill Carisoprodol (Tab) SOMA 350 MG Take 1 tablet (350 mg total) by mouth 3 (three) times daily as needed. For pain #90 last fill 11.1.13 last ov 8.6.13 Hospt.Follow UP

## 2012-09-12 NOTE — Telephone Encounter (Signed)
Rx sent 

## 2012-09-12 NOTE — Telephone Encounter (Signed)
Ok for #90, 3 refills 

## 2012-09-12 NOTE — Telephone Encounter (Signed)
05-23-12 Last OV, last filled #90 3

## 2013-01-05 ENCOUNTER — Telehealth: Payer: Self-pay | Admitting: Family Medicine

## 2013-01-05 DIAGNOSIS — I10 Essential (primary) hypertension: Secondary | ICD-10-CM

## 2013-01-05 MED ORDER — VALSARTAN 320 MG PO TABS: ORAL_TABLET | ORAL | Status: DC

## 2013-01-05 NOTE — Telephone Encounter (Signed)
Pt is calling to specify to the office that she needs this refill of Diovan. (see earlier refill fax from pharmacy ) That she hasn't had it in 2 days and that she would like the refill [facility] into [facility].

## 2013-01-05 NOTE — Telephone Encounter (Signed)
Discussed with patient and made her aware Apt due, she relies on transportation so we scheduled it out 2 weeks. Apt scheduled 01/19/13 at 11:15 with Dr.Tabori and a 30 supply has been faxed to the pharmacy.     KP

## 2013-01-05 NOTE — Telephone Encounter (Signed)
ALSO REFILL  DIOVAN 320 MG TABLETS #30  TAKE 1 TABLET BY MOUTH EVERY DAY  LAST FILLED ON 02.12.2014

## 2013-01-09 ENCOUNTER — Other Ambulatory Visit: Payer: Self-pay | Admitting: *Deleted

## 2013-01-09 DIAGNOSIS — I10 Essential (primary) hypertension: Secondary | ICD-10-CM

## 2013-01-09 MED ORDER — VALSARTAN 320 MG PO TABS: ORAL_TABLET | ORAL | Status: DC

## 2013-01-09 NOTE — Telephone Encounter (Signed)
[facility] [address]

## 2013-01-11 ENCOUNTER — Telehealth: Payer: Self-pay | Admitting: *Deleted

## 2013-01-11 MED ORDER — OLMESARTAN MEDOXOMIL 40 MG PO TABS: 40.0000 mg | ORAL_TABLET | Freq: Every day | ORAL | Status: DC

## 2013-01-11 NOTE — Telephone Encounter (Signed)
LM @ (8:30am) asking the pt to RTC.//AB/CMA

## 2013-01-12 NOTE — Telephone Encounter (Signed)
Spoke with the pt and informed her that we received fax from the pharmacy to say that her Diovan 320 mg is not covered by her ins and will need to change it.  Informed her that Dr. Beverely Low changed her to Benicar 40mg  one daily.  Pt stated that she was not taking the Diovan 320mg , but she decreased herself to 160mg  which she had on hand.  She said that she was increased from 80mg , to 160mg , to 320mg , and now she has loss wt and she feels the 160mg  is good.  Informed the pt that it's not good to change dose of med w/o the her provider knowing.  Pt stated okay, and said she has an appt with Dr. Beverely Low next week.  Informed the pt that she will need to go ahead and start on the Benicar 40mg  daily, and pt agreed.//AB/CMA

## 2013-01-18 ENCOUNTER — Encounter: Payer: Self-pay | Admitting: Lab

## 2013-01-19 ENCOUNTER — Encounter: Payer: Self-pay | Admitting: Family Medicine

## 2013-01-19 ENCOUNTER — Ambulatory Visit (INDEPENDENT_AMBULATORY_CARE_PROVIDER_SITE_OTHER): Payer: Medicare Other | Admitting: Family Medicine

## 2013-01-19 VITALS — BP 130/90 | HR 86 | Temp 98.0°F | Ht 70.25 in | Wt >= 6400 oz

## 2013-01-19 DIAGNOSIS — E119 Type 2 diabetes mellitus without complications: Secondary | ICD-10-CM

## 2013-01-19 DIAGNOSIS — E785 Hyperlipidemia, unspecified: Secondary | ICD-10-CM

## 2013-01-19 DIAGNOSIS — N912 Amenorrhea, unspecified: Secondary | ICD-10-CM

## 2013-01-19 DIAGNOSIS — I10 Essential (primary) hypertension: Secondary | ICD-10-CM

## 2013-01-19 LAB — CBC WITH DIFFERENTIAL/PLATELET
Basophils Absolute: 0 10*3/uL (ref 0.0–0.1)
Basophils Relative: 0.6 % (ref 0.0–3.0)
Eosinophils Absolute: 0.1 10*3/uL (ref 0.0–0.7)
Hemoglobin: 10.7 g/dL — ABNORMAL LOW (ref 12.0–15.0)
Lymphocytes Relative: 33.9 % (ref 12.0–46.0)
Lymphs Abs: 3 10*3/uL (ref 0.7–4.0)
MCHC: 33 g/dL (ref 30.0–36.0)
MCV: 88 fl (ref 78.0–100.0)
Monocytes Absolute: 0.5 10*3/uL (ref 0.1–1.0)
Neutro Abs: 5.2 10*3/uL (ref 1.4–7.7)
RBC: 3.67 Mil/uL — ABNORMAL LOW (ref 3.87–5.11)
RDW: 15.4 % — ABNORMAL HIGH (ref 11.5–14.6)

## 2013-01-19 LAB — BASIC METABOLIC PANEL
BUN: 5 mg/dL — ABNORMAL LOW (ref 6–23)
Calcium: 8 mg/dL — ABNORMAL LOW (ref 8.4–10.5)
Creatinine, Ser: 0.5 mg/dL (ref 0.4–1.2)
GFR: 169.93 mL/min (ref >60.00)

## 2013-01-19 LAB — HEPATIC FUNCTION PANEL
ALT: 16 U/L (ref 0–35)
Bilirubin, Direct: 0.1 mg/dL (ref 0.0–0.3)
Total Bilirubin: 0.3 mg/dL (ref 0.3–1.2)

## 2013-01-19 LAB — LIPID PANEL
Cholesterol: 160 mg/dL (ref 0–200)
HDL: 39.4 mg/dL (ref >39.00)
LDL Cholesterol: 101 mg/dL — ABNORMAL HIGH (ref 0–99)
Triglycerides: 99 mg/dL (ref 0.0–149.0)
VLDL: 19.8 mg/dL (ref 0.0–40.0)

## 2013-01-19 LAB — TSH: TSH: 2.67 u[IU]/mL (ref 0.35–5.50)

## 2013-01-19 LAB — POCT URINE PREGNANCY: Preg Test, Ur: NEGATIVE

## 2013-01-19 NOTE — Assessment & Plan Note (Signed)
Chronic problem.  Fair but not ideal control today.  Pt has yet to start Benicar.  Med was switched due to formulary change.  Asymptomatic.  Will follow.

## 2013-01-19 NOTE — Assessment & Plan Note (Signed)
Chronic problem.  Never started lipitor as recommended.  Pt reports she wants to try and focus on healthy diet and regular exercise.  Check labs, start meds prn.

## 2013-01-19 NOTE — Assessment & Plan Note (Signed)
Check Upreg at pt's request- negative.

## 2013-01-19 NOTE — Assessment & Plan Note (Signed)
Chronic problem.  Pt was attempting to control w/ diet and exercise.  Not currently on meds.  Asymptomatic.  Overdue for eye exam.  Encouraged her to schedule.  Will get labs and start meds prn.  Pt expressed understanding and is in agreement w/ plan.

## 2013-01-19 NOTE — Patient Instructions (Addendum)
Follow up in 3 months to recheck sugars and BP Start the Benicar daily We'll notify you of your lab results and make any changes if needed Call with any questions or concerns [place]!

## 2013-01-19 NOTE — Progress Notes (Signed)
  Subjective:    Patient ID: Kelsey Schmitt, female    DOB: 08-27-1976, 37 y.o.   MRN: [id number]  HPI HTN- chronic problem, pt previously on Diovan but new insurance is requiring new med.  Switched to [employer] but has not started this.  Denies CP, SOB, HAs, visual changes, edema.  DM- chronic problem, not currently on any prescription meds.  Is attempting to control w/ diet.  Last A1C 7.  Pt is planning to focus on weight loss- morbidly obese.  Overdue on eye exam.  Hyperlipidemia- chronic problem, notes from last lab results show that pt was supposed to start Lipitor.  Not currently on meds.  Due for labs.  Pt would like Upreg due to unprotected sex.  LMP 3/4.  Is now married and not trying to prevent pregnancy.   Review of Systems For ROS see HPI     Objective:   Physical Exam  Vitals reviewed. Constitutional: She is oriented to person, place, and time. She appears well-developed and well-nourished. No distress.  Morbidly obese  HENT:  Head: Normocephalic and atraumatic.  Eyes: Conjunctivae and EOM are normal. Pupils are equal, round, and reactive to light.  Neck: Normal range of motion. Neck supple. No thyromegaly present.  Cardiovascular: Normal rate, regular rhythm, normal heart sounds and intact distal pulses.   No murmur heard. Pulmonary/Chest: Effort normal and breath sounds normal. No respiratory distress.  Abdominal: Soft. She exhibits no distension. There is no tenderness.  Musculoskeletal: She exhibits edema (nonpitting BLE edema).  Lymphadenopathy:    She has no cervical adenopathy.  Neurological: She is alert and oriented to person, place, and time.  Skin: Skin is warm and dry.  Psychiatric: She has a normal mood and affect. Her behavior is normal.          Assessment & Plan:

## 2013-02-08 ENCOUNTER — Telehealth: Payer: Self-pay | Admitting: General Practice

## 2013-02-08 NOTE — Telephone Encounter (Signed)
Pt MUST go to ER if having CP

## 2013-02-08 NOTE — Telephone Encounter (Signed)
Pt was notified and advised to go to ER for chest pain.

## 2013-02-08 NOTE — Telephone Encounter (Signed)
Pt states she went to a BP specialist at [facility] today and he told her her BP was 189/93 at noon (pt called office at 3:15pm). Pt stated that she is having slight Chest pain, mild dizziness, and a loud thumping in her ears. Pt called stating that the current prescription of Benicar has caused her to have gout and she would like to have something similar to her Diovan. However, states that she had been able to tolerate Norvasc in the past.   Pt was advised to seek treatment at the ER if her symptoms remain persistent or worsen. Please advise.

## 2013-02-08 NOTE — Telephone Encounter (Signed)
Also- Benicar IS similar to Diovan and unless it has HCTZ in it, will not cause gout.  Needs to restart BP med immediately.

## 2013-02-09 ENCOUNTER — Encounter (HOSPITAL_COMMUNITY): Payer: Self-pay | Admitting: Emergency Medicine

## 2013-02-09 ENCOUNTER — Emergency Department (HOSPITAL_COMMUNITY): Payer: Medicare Other

## 2013-02-09 ENCOUNTER — Emergency Department (HOSPITAL_COMMUNITY)
Admission: EM | Admit: 2013-02-09 | Discharge: 2013-02-09 | Disposition: A | Discharge status: Home / Self Care | Payer: Medicare Other | Attending: Emergency Medicine | Admitting: Emergency Medicine

## 2013-02-09 DIAGNOSIS — M129 Arthropathy, unspecified: Secondary | ICD-10-CM | POA: Insufficient documentation

## 2013-02-09 DIAGNOSIS — R42 Dizziness and giddiness: Secondary | ICD-10-CM | POA: Insufficient documentation

## 2013-02-09 DIAGNOSIS — R079 Chest pain, unspecified: Secondary | ICD-10-CM

## 2013-02-09 DIAGNOSIS — M109 Gout, unspecified: Secondary | ICD-10-CM | POA: Insufficient documentation

## 2013-02-09 DIAGNOSIS — R0789 Other chest pain: Secondary | ICD-10-CM | POA: Insufficient documentation

## 2013-02-09 DIAGNOSIS — Z79899 Other long term (current) drug therapy: Secondary | ICD-10-CM | POA: Insufficient documentation

## 2013-02-09 DIAGNOSIS — E785 Hyperlipidemia, unspecified: Secondary | ICD-10-CM | POA: Insufficient documentation

## 2013-02-09 DIAGNOSIS — E119 Type 2 diabetes mellitus without complications: Secondary | ICD-10-CM | POA: Insufficient documentation

## 2013-02-09 DIAGNOSIS — J45909 Unspecified asthma, uncomplicated: Secondary | ICD-10-CM | POA: Insufficient documentation

## 2013-02-09 DIAGNOSIS — I10 Essential (primary) hypertension: Secondary | ICD-10-CM | POA: Insufficient documentation

## 2013-02-09 DIAGNOSIS — Z8739 Personal history of other diseases of the musculoskeletal system and connective tissue: Secondary | ICD-10-CM | POA: Insufficient documentation

## 2013-02-09 DIAGNOSIS — Z87828 Personal history of other (healed) physical injury and trauma: Secondary | ICD-10-CM | POA: Insufficient documentation

## 2013-02-09 LAB — PROTIME-INR: INR: 0.98 (ref 0.00–1.49)

## 2013-02-09 LAB — CBC
MCH: 29.3 pg (ref 26.0–34.0)
MCV: 86.9 fL (ref 78.0–100.0)
Platelets: 253 10*3/uL (ref 150–400)
RBC: 3.51 MIL/uL — ABNORMAL LOW (ref 3.87–5.11)

## 2013-02-09 LAB — COMPREHENSIVE METABOLIC PANEL
AST: 12 U/L (ref 0–37)
CO2: 27 mEq/L (ref 19–32)
Calcium: 8.5 mg/dL (ref 8.4–10.5)
Creatinine, Ser: 0.59 mg/dL (ref 0.50–1.10)
GFR calc Af Amer: >90 mL/min (ref >90)
GFR calc non Af Amer: >90 mL/min (ref >90)

## 2013-02-09 LAB — APTT: aPTT: 27 seconds (ref 24–37)

## 2013-02-09 MED ORDER — SODIUM CHLORIDE 0.9 % IV SOLN: 1000.0000 mL | INTRAVENOUS | Status: DC

## 2013-02-09 NOTE — ED Provider Notes (Signed)
History    CSN: [id number] Arrival date & time 02/09/13  4098 First MD Initiated Contact with Patient 02/09/13 1958     Chief Complaint  Patient presents with  . Chest Pain   HPI Patient presents to the emergency room with complaints of chest pressure. Patient states she had an episode today that lasted maybe a few seconds. She felt somewhat lightheaded and dizzy as well. Did not have any trouble with shortness of breath or diaphoresis. She did not have any radiation of her symptoms to her jaw or arms.  Patient has history of hypertension. She stopped taking her medications about 2 weeks ago because she was experiencing some side effects. Her medications have been changed to 2 insurance reasons. Patient is noted a couple days ago that her blood pressure was elevated when she checked in at a drugstore. She called her doctor and was instructed to resume taking her blood pressure medications. She was told to followup in the office when she had the episode of chest pressure she decided to come emergently evaluated. Patient does not have history of heart disease. She does not have history of pulmonary embolism or DVT. There is no family history of coronary artery disease. Past Medical History  Diagnosis Date  . Asthma   . Arthritis   . Diabetes mellitus     uses natural ERBS  . GERD (gastroesophageal reflux disease)   . Gout   . Pulmonary hypertension   . Allergy   . Head injury 2002  . PCOS (polycystic ovarian syndrome)   . Fibromyalgia   . Obesity   . Unspecified essential hypertension   . Arthritis, rheumatoid   . Hyperlipidemia     Past Surgical History  Procedure Laterality Date  . No past surgeries      Family History  Problem Relation Age of Onset  . Hypertension Mother   . Heart disease Mother   . Rheum arthritis Mother   . Diabetes Brother   . Hypertension Brother   . Lung cancer Maternal Uncle   . Breast cancer Maternal Aunt   . Heart disease Father   . Hypertension  Father   . Heart disease Paternal Grandfather     History  Substance Use Topics  . Smoking status: Never Smoker   . Smokeless tobacco: Never Used  . Alcohol Use: No    OB History   Grav Para Term Preterm Abortions TAB SAB Ect Mult Living   0               Review of Systems  All other systems reviewed and are negative.    Allergies  Cymbalta; Ibuprofen; Lisinopril; Lyrica; and Naproxen  Home Medications   Current Outpatient Rx  Name  Route  Sig  Dispense  Refill  . acetaminophen (TYLENOL) 325 MG tablet   Oral   Take 650 mg by mouth every 6 (six) hours as needed for pain (pain).         . carisoprodol (SOMA) 350 MG tablet   Oral   Take 1 tablet (350 mg total) by mouth 3 (three) times daily as needed. For pain   90 tablet   3   . diclofenac sodium (VOLTAREN) 1 % GEL   Topical   Apply 1 application topically daily as needed. For bone and joint pain         . HYDROcodone-acetaminophen (NORCO) 7.5-325 MG per tablet   Oral   Take 1 tablet by mouth 2 (two) times daily  as needed for pain.         Marland Kitchen ibuprofen (ADVIL,MOTRIN) 200 MG tablet   Oral   Take 400 mg by mouth every 6 (six) hours as needed. For pain         . olmesartan (BENICAR) 40 MG tablet   Oral   Take 1 tablet (40 mg total) by mouth daily. Need OV in 1 month   30 tablet   0   . omeprazole (PRILOSEC) 40 MG capsule   Oral   Take 1 capsule (40 mg total) by mouth daily.   30 capsule   6     BP 116/59  Pulse 69  Temp(Src) 98.7 F (37.1 C) (Oral)  Resp 21  Ht 5\' 10"  (1.778 m)  Wt 408 lb (185.068 kg)  BMI 58.54 kg/m2  SpO2 99%  LMP 02/05/2013  Physical Exam  Nursing note and vitals reviewed. Constitutional: No distress.  Morbidly obese  HENT:  Head: Normocephalic and atraumatic.  Right Ear: External ear normal.  Left Ear: External ear normal.  Eyes: Conjunctivae are normal. Right eye exhibits no discharge. Left eye exhibits no discharge. No scleral icterus.  Neck: Neck supple. No  tracheal deviation present.  Cardiovascular: Normal rate, regular rhythm and intact distal pulses.   Pulmonary/Chest: Effort normal and breath sounds normal. No stridor. No respiratory distress. She has no wheezes. She has no rales.  Abdominal: Soft. Bowel sounds are normal. She exhibits no distension. There is no tenderness. There is no rebound and no guarding.  Musculoskeletal: She exhibits no edema and no tenderness.  No calf tenderness or swelling  Neurological: She is alert. She has normal strength. No sensory deficit. Cranial nerve deficit:  no gross defecits noted. She exhibits normal muscle tone. She displays no seizure activity. Coordination normal.  Skin: Skin is warm and dry. No rash noted.  Psychiatric: She has a normal mood and affect.    ED Course  Procedures (including critical care time) EKG Normal sinus rhythm rate 80 Normal axis, normal intervals Normal a CT with Labs Reviewed  CBC - Abnormal; Notable for the following:    WBC 11.8 (*)    RBC 3.51 (*)    Hemoglobin 10.3 (*)    HCT 30.5 (*)    All other components within normal limits  COMPREHENSIVE METABOLIC PANEL - Abnormal; Notable for the following:    Glucose, Bld 128 (*)    Albumin 3.1 (*)    Total Bilirubin 0.1 (*)    All other components within normal limits  PROTIME-INR  APTT  POCT I-STAT TROPONIN I   Dg Chest 2 View  02/09/2013  *RADIOLOGY REPORT*  Clinical Data: Chest pressure, shortness of breath  CHEST - 2 VIEW  Comparison: 10/13/2011  Findings: Cardiomediastinal silhouette is within normal limits. The lungs are clear. No pleural effusion.  No pneumothorax.  No acute osseous abnormality.  IMPRESSION: No acute cardiopulmonary process.   Original Report Authenticated By: Christiana Pellant, M.D.      1. Chest pain   2. Hypertension       MDM  The patient's symptoms are atypical for coronary artery disease.  Discomfort just lasted a few seconds. He also suspicion for pulmonary embolism. The patient  does not have any risk factors. She is not tachycardic or tachypneic. She does not take any estrogen containing medications.  Patient was worried about her blood pressure. Here in the emergency department her blood pressure is normal. I discussed close outpatient followup and warning signs that  should prompt return to the emergency department.        Celene Kras, MD 02/09/13 2227

## 2013-02-09 NOTE — ED Notes (Signed)
Pt states she did not restarted Benicar 2 days ago, pt states she had stopped taking meds x 2 weeks because the med made her felt funny. Today pt felt chest pressure with dizziness. PWD, A & O

## 2013-02-26 ENCOUNTER — Other Ambulatory Visit: Payer: Self-pay | Admitting: Family Medicine

## 2013-02-27 NOTE — Telephone Encounter (Signed)
Med filled.  

## 2013-03-19 ENCOUNTER — Telehealth: Payer: Self-pay | Admitting: Family Medicine

## 2013-03-19 MED ORDER — VALSARTAN 320 MG PO TABS: 320.0000 mg | ORAL_TABLET | Freq: Every day | ORAL | Status: DC

## 2013-03-19 NOTE — Telephone Encounter (Signed)
Ok for Diovan 320mg , #30, 6 refills if ok w/ insurance (which is why she was changed in the 1st place)

## 2013-03-19 NOTE — Telephone Encounter (Signed)
Pt last appt 01-19-13. Ok to send in the Diovan?

## 2013-03-19 NOTE — Telephone Encounter (Signed)
Patient is asking for her blood pressure medication [facility] [address] Says that she does not have any refills. Says that she was switched to Venicar but it was lowering her blood pressure so she does not want to continue that medication.

## 2013-03-19 NOTE — Telephone Encounter (Signed)
Asked pt about the insurance covering medication. Pt informed me that she has a new insurance company now and they cover the medication.

## 2013-04-04 ENCOUNTER — Emergency Department (HOSPITAL_COMMUNITY)
Admission: EM | Admit: 2013-04-04 | Discharge: 2013-04-05 | Disposition: A | Discharge status: Home / Self Care | Payer: Medicare Other | Attending: Emergency Medicine | Admitting: Emergency Medicine

## 2013-04-04 ENCOUNTER — Emergency Department (HOSPITAL_COMMUNITY): Payer: Medicare Other

## 2013-04-04 ENCOUNTER — Encounter (HOSPITAL_COMMUNITY): Payer: Self-pay

## 2013-04-04 DIAGNOSIS — Z862 Personal history of diseases of the blood and blood-forming organs and certain disorders involving the immune mechanism: Secondary | ICD-10-CM | POA: Insufficient documentation

## 2013-04-04 DIAGNOSIS — M109 Gout, unspecified: Secondary | ICD-10-CM | POA: Insufficient documentation

## 2013-04-04 DIAGNOSIS — Z79899 Other long term (current) drug therapy: Secondary | ICD-10-CM | POA: Insufficient documentation

## 2013-04-04 DIAGNOSIS — IMO0001 Reserved for inherently not codable concepts without codable children: Secondary | ICD-10-CM | POA: Insufficient documentation

## 2013-04-04 DIAGNOSIS — M549 Dorsalgia, unspecified: Secondary | ICD-10-CM | POA: Insufficient documentation

## 2013-04-04 DIAGNOSIS — Z8639 Personal history of other endocrine, nutritional and metabolic disease: Secondary | ICD-10-CM | POA: Insufficient documentation

## 2013-04-04 DIAGNOSIS — M546 Pain in thoracic spine: Secondary | ICD-10-CM

## 2013-04-04 DIAGNOSIS — Z8719 Personal history of other diseases of the digestive system: Secondary | ICD-10-CM | POA: Insufficient documentation

## 2013-04-04 DIAGNOSIS — Z87828 Personal history of other (healed) physical injury and trauma: Secondary | ICD-10-CM | POA: Insufficient documentation

## 2013-04-04 DIAGNOSIS — Z3202 Encounter for pregnancy test, result negative: Secondary | ICD-10-CM | POA: Insufficient documentation

## 2013-04-04 DIAGNOSIS — J45909 Unspecified asthma, uncomplicated: Secondary | ICD-10-CM | POA: Insufficient documentation

## 2013-04-04 DIAGNOSIS — Z8742 Personal history of other diseases of the female genital tract: Secondary | ICD-10-CM | POA: Insufficient documentation

## 2013-04-04 DIAGNOSIS — I10 Essential (primary) hypertension: Secondary | ICD-10-CM | POA: Insufficient documentation

## 2013-04-04 DIAGNOSIS — E119 Type 2 diabetes mellitus without complications: Secondary | ICD-10-CM | POA: Insufficient documentation

## 2013-04-04 LAB — CBC WITH DIFFERENTIAL/PLATELET
Basophils Relative: 0 % (ref 0–1)
Eosinophils Absolute: 0.1 10*3/uL (ref 0.0–0.7)
HCT: 34.2 % — ABNORMAL LOW (ref 36.0–46.0)
Hemoglobin: 11 g/dL — ABNORMAL LOW (ref 12.0–15.0)
MCH: 28.2 pg (ref 26.0–34.0)
MCHC: 32.2 g/dL (ref 30.0–36.0)
MCV: 87.7 fL (ref 78.0–100.0)
Monocytes Absolute: 0.5 10*3/uL (ref 0.1–1.0)
Monocytes Relative: 4 % (ref 3–12)

## 2013-04-04 LAB — URINALYSIS, ROUTINE W REFLEX MICROSCOPIC
Glucose, UA: NEGATIVE mg/dL
Hgb urine dipstick: NEGATIVE
Ketones, ur: NEGATIVE mg/dL
Protein, ur: NEGATIVE mg/dL

## 2013-04-04 LAB — POCT I-STAT, CHEM 8
Creatinine, Ser: 0.8 mg/dL (ref 0.50–1.10)
Glucose, Bld: 134 mg/dL — ABNORMAL HIGH (ref 70–99)
Hemoglobin: 11.9 g/dL — ABNORMAL LOW (ref 12.0–15.0)
Potassium: 4 mEq/L (ref 3.5–5.1)
TCO2: 28 mmol/L (ref 0–100)

## 2013-04-04 MED ORDER — SODIUM CHLORIDE 0.9 % IV SOLN
Freq: Once | INTRAVENOUS | Status: AC
  Administered 2013-04-04: via INTRAVENOUS

## 2013-04-04 MED ORDER — IBUPROFEN 800 MG PO TABS
800.0000 mg | ORAL_TABLET | Freq: Once | ORAL | Status: AC
  Administered 2013-04-04: 800 mg via ORAL
  Filled 2013-04-04: qty 1

## 2013-04-04 MED ORDER — IOHEXOL 350 MG/ML SOLN: 100.0000 mL | Freq: Once | INTRAVENOUS | Status: AC | PRN

## 2013-04-04 NOTE — ED Notes (Signed)
Pt presents with back pain that began about a week ago. Denies injury. Pt says the pain is on the left side in the middle of her back. Pt says she is worried that she is pregnant. Pt says she has been constantly nauseated for a little more than a month now. Pt says she has vomited several times over the last month related to the nausea. No painful urination but is experiencing urinary frequency.

## 2013-04-05 ENCOUNTER — Encounter (HOSPITAL_COMMUNITY): Payer: Self-pay | Admitting: Radiology

## 2013-04-05 MED ORDER — DIAZEPAM 5 MG PO TABS: 5.0000 mg | ORAL_TABLET | Freq: Two times a day (BID) | ORAL | Status: DC

## 2013-04-05 MED ORDER — IOHEXOL 350 MG/ML SOLN
100.0000 mL | Freq: Once | INTRAVENOUS | Status: AC | PRN
  Administered 2013-04-05: 100 mL via INTRAVENOUS

## 2013-04-05 MED ORDER — HYDROCODONE-ACETAMINOPHEN 5-325 MG PO TABS: 1.0000 | ORAL_TABLET | Freq: Four times a day (QID) | ORAL | Status: DC | PRN

## 2013-04-05 NOTE — ED Provider Notes (Signed)
History     CSN: [id number]  Arrival date & time 04/04/13  1925   First MD Initiated Contact with Patient 04/04/13 2152      Chief Complaint  Patient presents with  . Back Pain    (Consider location/radiation/quality/duration/timing/severity/associated sxs/prior treatment) HPI Comments: Is a 37 year old morbidly obese, female, who presents today complaining of intermittent episodes of sharp, shooting back pain.  That starts in her thoracic region and radiates to the left mid axillary line.  This is also an area, where she's had an abscess previously.  She, states, about one month ago.  She fell down.  Several steps, but has been fine since that time.  Denies any shortness of breath.  She, says on 3 occasions during this, week.  She's had pain that suddenly came on shot through this area and caused your almost stagger and fall.  Because of the intensity of the pain.  She also is having a gout flare in her right foot, for which she is taking ibuprofen.  She has not contacted her primary care physician she has not had any recent travel, history.  No fevers, nausea, numbness, shortness of breath, cough  Patient is a 37 y.o. female presenting with back pain. The history is provided by the patient.  Back Pain Location:  Thoracic spine Quality:  Aching Radiates to:  Does not radiate Pain severity:  Moderate Pain is:  Unable to specify Onset quality:  Unable to specify Timing:  Intermittent Progression:  Unchanged Chronicity:  New Relieved by:  Nothing Worsened by:  Ambulation and movement Ineffective treatments:  None tried Associated symptoms: no abdominal pain, no chest pain, no dysuria, no fever, no headaches, no numbness and no paresthesias     Past Medical History  Diagnosis Date  . Asthma   . Arthritis   . Diabetes mellitus     uses natural ERBS  . GERD (gastroesophageal reflux disease)   . Gout   . Pulmonary hypertension   . Allergy   . Head injury 2002  . PCOS  (polycystic ovarian syndrome)   . Fibromyalgia   . Obesity   . Unspecified essential hypertension   . Arthritis, rheumatoid   . Hyperlipidemia     Past Surgical History  Procedure Laterality Date  . No past surgeries      Family History  Problem Relation Age of Onset  . Hypertension Mother   . Heart disease Mother   . Rheum arthritis Mother   . Diabetes Brother   . Hypertension Brother   . Lung cancer Maternal Uncle   . Breast cancer Maternal Aunt   . Heart disease Father   . Hypertension Father   . Heart disease Paternal Grandfather     History  Substance Use Topics  . Smoking status: Never Smoker   . Smokeless tobacco: Never Used  . Alcohol Use: No    OB History   Grav Para Term Preterm Abortions TAB SAB Ect Mult Living   0               Review of Systems  Constitutional: Negative for fever and chills.  Respiratory: Negative for cough and shortness of breath.   Cardiovascular: Negative for chest pain and leg swelling.  Gastrointestinal: Negative for abdominal pain.  Genitourinary: Negative for dysuria.  Musculoskeletal: Positive for back pain. Negative for myalgias.  Skin: Negative for rash and wound.  Neurological: Negative for numbness, headaches and paresthesias.  All other systems reviewed and are negative.  Allergies  Cymbalta; Ibuprofen; Lisinopril; Lyrica; and Naproxen  Home Medications   Current Outpatient Rx  Name  Route  Sig  Dispense  Refill  . diclofenac sodium (VOLTAREN) 1 % GEL   Topical   Apply 1 application topically daily as needed. For bone and joint pain         . valsartan (DIOVAN) 320 MG tablet   Oral   Take 1 tablet (320 mg total) by mouth daily.   30 tablet   6   . diazepam (VALIUM) 5 MG tablet   Oral   Take 1 tablet (5 mg total) by mouth 2 (two) times daily.   10 tablet   0   . HYDROcodone-acetaminophen (NORCO/VICODIN) 5-325 MG per tablet   Oral   Take 1 tablet by mouth every 6 (six) hours as needed for  pain.   30 tablet   0     BP 133/56  Pulse 92  Temp(Src) 98.2 F (36.8 C) (Oral)  Resp 19  Ht 5\' 10"  (1.778 m)  Wt 380 lb (172.367 kg)  BMI 54.52 kg/m2  SpO2 99%  LMP 02/04/2013  Physical Exam  Vitals reviewed. Constitutional: She is oriented to person, place, and time. She appears well-developed and well-nourished.  HENT:  Head: Normocephalic.  Eyes: Pupils are equal, round, and reactive to light.  Neck: Normal range of motion.  Cardiovascular: Normal rate.   Pulmonary/Chest: Effort normal.  Assessment difficult to to body habitus  Abdominal: Soft. Bowel sounds are normal.  Assessment difficult, due to body habitus  Neurological: She is alert and oriented to person, place, and time.  Skin: Skin is warm. No rash noted. No erythema.    ED Course  Procedures (including critical care time)  Labs Reviewed  CBC WITH DIFFERENTIAL - Abnormal; Notable for the following:    WBC 13.1 (*)    Hemoglobin 11.0 (*)    HCT 34.2 (*)    Neutro Abs 8.4 (*)    Lymphs Abs 4.1 (*)    All other components within normal limits  POCT I-STAT, CHEM 8 - Abnormal; Notable for the following:    Glucose, Bld 134 (*)    Calcium, Ion 1.10 (*)    Hemoglobin 11.9 (*)    HCT 35.0 (*)    All other components within normal limits  URINALYSIS, ROUTINE W REFLEX MICROSCOPIC  HCG, QUANTITATIVE, PREGNANCY  POCT PREGNANCY, URINE   Ct Angio Chest Pe W/cm &/or Wo Cm  04/05/2013   *RADIOLOGY REPORT*  Clinical Data: Shortness of breath.  Posterior chest pain.  Cough. Evaluate for potential abscess in the left axilla.  CT ANGIOGRAPHY CHEST  Technique:  Multidetector CT imaging of the chest using the standard protocol during bolus administration of intravenous contrast. Multiplanar reconstructed images including MIPs were obtained and reviewed to evaluate the vascular anatomy.  Contrast: OMNIPAQUE IOHEXOL 350 MG/ML SOLN  Comparison: No priors.  Findings:  Mediastinum: The study is suboptimal related to a  large amount of imaged noise from the patient's large body habitus, respiratory motion, as well as slightly suboptimal contrast bolus.  With these limitations in mind, there is no evidence to suggest clinically relevant central, lobar or proximal segmental sized pulmonary embolism.  More distal subsegmental sized pulmonary embolus cannot be entirely excluded on the basis of this examination. Heart size is mildly enlarged. There is no significant pericardial fluid, thickening or pericardial calcification. No pathologically enlarged mediastinal or hilar lymph nodes. Esophagus is unremarkable in appearance.  Lungs/Pleura: No acute  consolidative airspace disease.  No pleural effusions.  No pneumothorax.  No definite suspicious appearing pulmonary nodules or masses are identified.  Upper Abdomen: Unremarkable.  Musculoskeletal: No abnormal rim enhancing fluid collection within the left axilla to suggest underlying abscess.  There are no aggressive appearing lytic or blastic lesions noted in the visualized portions of the skeleton.  IMPRESSION: 1.  Despite the limitations of today's examination there is no evidence to suggest clinically relevant central, lobar or segmental sized pulmonary embolism. 2.  No acute findings in the thorax to account for the patient's symptoms. Specifically, no abscess in the left axilla. 3.  Mild cardiomegaly.   Original Report Authenticated By: Trudie Reed, M.D.     1. Thoracolumbar back pain       MDM  Dr. Freida Busman examined.  The patient.  With ultrasound area in question to evaluate for abscess.  This was inconclusive.  CT scan was ordered to include upper abdomen.  This revealed no abscess.  No PE.  No pneumonia.  No rib  fractures.  Patient is reassured.  She is being discharged home with Valium for muscle spasm, as well as Vicodin         Arman Filter, NP 04/05/13 0102

## 2013-04-06 NOTE — ED Provider Notes (Signed)
Medical screening examination/treatment/procedure(s) were performed by non-physician practitioner and as supervising physician I was immediately available for consultation/collaboration.  Rivky Clendenning T Dayanira Giovannetti, MD 04/06/13 1613 

## 2013-07-13 ENCOUNTER — Ambulatory Visit: Payer: Medicare Other | Admitting: Family Medicine

## 2013-07-16 ENCOUNTER — Ambulatory Visit (INDEPENDENT_AMBULATORY_CARE_PROVIDER_SITE_OTHER): Payer: Medicare Other | Admitting: Family Medicine

## 2013-07-16 ENCOUNTER — Encounter: Payer: Self-pay | Admitting: Family Medicine

## 2013-07-16 VITALS — BP 128/80 | HR 73 | Temp 98.1°F | Wt 399.8 lb

## 2013-07-16 DIAGNOSIS — E119 Type 2 diabetes mellitus without complications: Secondary | ICD-10-CM

## 2013-07-16 DIAGNOSIS — K219 Gastro-esophageal reflux disease without esophagitis: Secondary | ICD-10-CM

## 2013-07-16 DIAGNOSIS — M25569 Pain in unspecified knee: Secondary | ICD-10-CM

## 2013-07-16 DIAGNOSIS — R5381 Other malaise: Secondary | ICD-10-CM | POA: Insufficient documentation

## 2013-07-16 DIAGNOSIS — E785 Hyperlipidemia, unspecified: Secondary | ICD-10-CM

## 2013-07-16 DIAGNOSIS — M25561 Pain in right knee: Secondary | ICD-10-CM

## 2013-07-16 DIAGNOSIS — I10 Essential (primary) hypertension: Secondary | ICD-10-CM

## 2013-07-16 LAB — CBC WITH DIFFERENTIAL/PLATELET
Basophils Relative: 0.6 % (ref 0.0–3.0)
Eosinophils Relative: 0.9 % (ref 0.0–5.0)
HCT: 34.4 % — ABNORMAL LOW (ref 36.0–46.0)
Hemoglobin: 11.4 g/dL — ABNORMAL LOW (ref 12.0–15.0)
Lymphs Abs: 3.6 10*3/uL (ref 0.7–4.0)
MCV: 87.9 fl (ref 78.0–100.0)
Monocytes Absolute: 0.4 10*3/uL (ref 0.1–1.0)
Monocytes Relative: 3.4 % (ref 3.0–12.0)
Neutro Abs: 6.8 10*3/uL (ref 1.4–7.7)
Platelets: 286 10*3/uL (ref 150.0–400.0)
WBC: 10.9 10*3/uL — ABNORMAL HIGH (ref 4.5–10.5)

## 2013-07-16 LAB — LIPID PANEL
Cholesterol: 193 mg/dL (ref 0–200)
HDL: 43.2 mg/dL (ref >39.00)
Triglycerides: 78 mg/dL (ref 0.0–149.0)
VLDL: 15.6 mg/dL (ref 0.0–40.0)

## 2013-07-16 LAB — BASIC METABOLIC PANEL
BUN: 9 mg/dL (ref 6–23)
CO2: 29 mEq/L (ref 19–32)
Calcium: 9 mg/dL (ref 8.4–10.5)
Creatinine, Ser: 0.6 mg/dL (ref 0.4–1.2)
GFR: 143.69 mL/min (ref >60.00)
Glucose, Bld: 106 mg/dL — ABNORMAL HIGH (ref 70–99)

## 2013-07-16 LAB — HEPATIC FUNCTION PANEL
Albumin: 3.8 g/dL (ref 3.5–5.2)
Alkaline Phosphatase: 49 U/L (ref 39–117)
Total Protein: 7.6 g/dL (ref 6.0–8.3)

## 2013-07-16 LAB — HEMOGLOBIN A1C: Hgb A1c MFr Bld: 8.7 % — ABNORMAL HIGH (ref 4.6–6.5)

## 2013-07-16 MED ORDER — TRAMADOL HCL 50 MG PO TABS: 50.0000 mg | ORAL_TABLET | Freq: Three times a day (TID) | ORAL | Status: DC | PRN

## 2013-07-16 MED ORDER — PANTOPRAZOLE SODIUM 40 MG PO TBEC: 40.0000 mg | DELAYED_RELEASE_TABLET | Freq: Every day | ORAL | Status: DC

## 2013-07-16 NOTE — Progress Notes (Signed)
  Subjective:    Patient ID: Kelsey Schmitt, female    DOB: 1975-10-20, 37 y.o.   MRN: [id number]  HPI DM- chronic problem, has been controlling w/ diet but has gained 20 lbs since June.  Reports increased fatigue.  Not checking sugars.  Saw Naturopath and was taking herbs.  Denies symptomatic lows but fears that sugar is high.  Daily AM nausea- pt feels that this is the Diovan.  Has hx of GERD but is not currently on meds.  Nausea improves w/ eating.  HTN- chronic problem, BP well controlled on Diovan.  Denies CP, SOB, HAs, visual changes, edema.  R knee pain- pt is now 400 lbs.  Reports she is unable to exercise due to pain.  Unable to raise knee to go up stairs.  Having 'horrible pain' across the front of the knee.  Has hx of patella injury.   Review of Systems For ROS see HPI     Objective:   Physical Exam  Vitals reviewed. Constitutional: She is oriented to person, place, and time. She appears well-developed and well-nourished. No distress.  Morbidly obese  HENT:  Head: Normocephalic and atraumatic.  Eyes: Conjunctivae and EOM are normal. Pupils are equal, round, and reactive to light.  Neck: Normal range of motion. Neck supple. No thyromegaly present.  Cardiovascular: Normal rate, regular rhythm, normal heart sounds and intact distal pulses.   No murmur heard. Pulmonary/Chest: Effort normal and breath sounds normal. No respiratory distress.  Abdominal: Soft. She exhibits no distension. There is no tenderness.  Musculoskeletal: She exhibits tenderness (over R anterior knee and patella). She exhibits no edema.  Lymphadenopathy:    She has no cervical adenopathy.  Neurological: She is alert and oriented to person, place, and time.  Skin: Skin is warm and dry.  Psychiatric: She has a normal mood and affect. Her behavior is normal.          Assessment & Plan:

## 2013-07-16 NOTE — Assessment & Plan Note (Signed)
New to provider.  Suspect this is due to pt's obesity but she reports unspecified previous patella injury.  Refer to ortho for complete evaluation and tx

## 2013-07-16 NOTE — Assessment & Plan Note (Signed)
Deteriorated.  Start PPI to improve pt's daily AM nausea.  Will follow.

## 2013-07-16 NOTE — Assessment & Plan Note (Signed)
Chronic problem.  Not currently on meds.  Pt was trying to control w/ diet and exercise but has gained 20 lbs since last visit.  Check labs.  Start meds prn.

## 2013-07-16 NOTE — Assessment & Plan Note (Signed)
New.  Check labs to r/o anemia, thyroid disorder, worsening sugar control.  Will determine next steps based on lab results.

## 2013-07-16 NOTE — Patient Instructions (Signed)
Follow up in 3 months to recheck diabetes We'll notify you of your lab results and make any changes if needed Someone will call you with your ortho appt Start the Tramadol as needed Call with any questions or concerns Hang in there!!

## 2013-07-16 NOTE — Assessment & Plan Note (Signed)
Chronic problem.  Pt has been trying to use a holistic, naturopath approach but has gained 20 lbs and is having sxs of high sugar.  Stressed importance of low carb diet, weight loss.  Check labs.  Start meds prn.

## 2013-07-16 NOTE — Assessment & Plan Note (Signed)
Chronic problem.  Adequate control.  Asymptomatic.  No med changes pending lab results.  Pt expressed understanding and is in agreement w/ plan.

## 2013-07-17 ENCOUNTER — Other Ambulatory Visit: Payer: Self-pay | Admitting: *Deleted

## 2013-07-17 MED ORDER — SITAGLIPTIN PHOS-METFORMIN HCL 50-500 MG PO TABS: 1.0000 | ORAL_TABLET | Freq: Two times a day (BID) | ORAL | Status: DC

## 2013-07-17 MED ORDER — ATORVASTATIN CALCIUM 10 MG PO TABS: 10.0000 mg | ORAL_TABLET | Freq: Every day | ORAL | Status: DC

## 2013-08-28 ENCOUNTER — Other Ambulatory Visit: Payer: Self-pay | Admitting: General Practice

## 2013-08-28 MED ORDER — VALSARTAN 320 MG PO TABS: 320.0000 mg | ORAL_TABLET | Freq: Every day | ORAL | Status: DC

## 2013-09-06 ENCOUNTER — Other Ambulatory Visit: Payer: Self-pay | Admitting: General Practice

## 2013-09-06 MED ORDER — VALSARTAN 320 MG PO TABS: 320.0000 mg | ORAL_TABLET | Freq: Every day | ORAL | Status: DC

## 2013-10-29 ENCOUNTER — Telehealth: Payer: Self-pay | Admitting: *Deleted

## 2013-10-29 ENCOUNTER — Emergency Department (HOSPITAL_COMMUNITY)
Admission: EM | Admit: 2013-10-29 | Discharge: 2013-10-29 | Disposition: A | Discharge status: Home / Self Care | Payer: Medicare Other | Attending: Emergency Medicine | Admitting: Emergency Medicine

## 2013-10-29 ENCOUNTER — Encounter (HOSPITAL_COMMUNITY): Payer: Self-pay | Admitting: Emergency Medicine

## 2013-10-29 DIAGNOSIS — Z79899 Other long term (current) drug therapy: Secondary | ICD-10-CM | POA: Insufficient documentation

## 2013-10-29 DIAGNOSIS — Y9389 Activity, other specified: Secondary | ICD-10-CM | POA: Insufficient documentation

## 2013-10-29 DIAGNOSIS — J45909 Unspecified asthma, uncomplicated: Secondary | ICD-10-CM | POA: Insufficient documentation

## 2013-10-29 DIAGNOSIS — IMO0001 Reserved for inherently not codable concepts without codable children: Secondary | ICD-10-CM | POA: Insufficient documentation

## 2013-10-29 DIAGNOSIS — M069 Rheumatoid arthritis, unspecified: Secondary | ICD-10-CM | POA: Insufficient documentation

## 2013-10-29 DIAGNOSIS — E119 Type 2 diabetes mellitus without complications: Secondary | ICD-10-CM | POA: Insufficient documentation

## 2013-10-29 DIAGNOSIS — E785 Hyperlipidemia, unspecified: Secondary | ICD-10-CM | POA: Insufficient documentation

## 2013-10-29 DIAGNOSIS — K219 Gastro-esophageal reflux disease without esophagitis: Secondary | ICD-10-CM | POA: Insufficient documentation

## 2013-10-29 DIAGNOSIS — E669 Obesity, unspecified: Secondary | ICD-10-CM | POA: Insufficient documentation

## 2013-10-29 DIAGNOSIS — Y929 Unspecified place or not applicable: Secondary | ICD-10-CM | POA: Insufficient documentation

## 2013-10-29 DIAGNOSIS — W2209XA Striking against other stationary object, initial encounter: Secondary | ICD-10-CM | POA: Insufficient documentation

## 2013-10-29 DIAGNOSIS — Z87820 Personal history of traumatic brain injury: Secondary | ICD-10-CM | POA: Insufficient documentation

## 2013-10-29 DIAGNOSIS — S060X0A Concussion without loss of consciousness, initial encounter: Secondary | ICD-10-CM

## 2013-10-29 MED ORDER — CARISOPRODOL 350 MG PO TABS: 350.0000 mg | ORAL_TABLET | Freq: Three times a day (TID) | ORAL | Status: DC

## 2013-10-29 MED ORDER — HYDROCODONE-ACETAMINOPHEN 5-325 MG PO TABS: 1.0000 | ORAL_TABLET | ORAL | Status: DC | PRN

## 2013-10-29 MED ORDER — CYCLOBENZAPRINE HCL 10 MG PO TABS: 10.0000 mg | ORAL_TABLET | Freq: Two times a day (BID) | ORAL | Status: DC | PRN

## 2013-10-29 MED ORDER — HYDROCODONE-ACETAMINOPHEN 5-325 MG PO TABS
1.0000 | ORAL_TABLET | Freq: Once | ORAL | Status: AC
  Administered 2013-10-29: 1 via ORAL
  Filled 2013-10-29: qty 1

## 2013-10-29 NOTE — Discharge Instructions (Signed)

## 2013-10-29 NOTE — ED Provider Notes (Signed)
CSN: [id number]     Arrival date & time 10/29/13  1039 History   First MD Initiated Contact with Patient 10/29/13 1104     Chief Complaint  Patient presents with  . Headache    The history is provided by the patient.   patient reports injuring her right face and head 3 days ago.  She leaned over and struck her face and head on the wall.  She reports mild to moderate pain in her right infraorbital region.  She denies trismus or malocclusion.  No dental injury.  She reports mild discomfort in her neck without weakness in her arms and legs.  No chest pain or shortness of breath.  She has a prior history of traumatic brain injury.  She's concerned this could be a repeat issue.  She had no loss consciousness after head injury.  She's on anticoagulants.  She's been on chronic pain medicine but is no longer on these.  She denies vomiting.  No change in her vision.  She was ambulatory back to the examination room the.    Past Medical History  Diagnosis Date  . Asthma   . Arthritis   . Diabetes mellitus     uses natural ERBS  . GERD (gastroesophageal reflux disease)   . Gout   . Pulmonary hypertension   . Allergy   . Head injury 2002  . PCOS (polycystic ovarian syndrome)   . Fibromyalgia   . Obesity   . Unspecified essential hypertension   . Arthritis, rheumatoid   . Hyperlipidemia    Past Surgical History  Procedure Laterality Date  . No past surgeries     Family History  Problem Relation Age of Onset  . Hypertension Mother   . Heart disease Mother   . Rheum arthritis Mother   . Diabetes Brother   . Hypertension Brother   . Lung cancer Maternal Uncle   . Breast cancer Maternal Aunt   . Heart disease Father   . Hypertension Father   . Heart disease Paternal Grandfather    History  Substance Use Topics  . Smoking status: Never Smoker   . Smokeless tobacco: Never Used  . Alcohol Use: No   OB History   Grav Para Term Preterm Abortions TAB SAB Ect Mult Living   0               Review of Systems  Neurological: Positive for headaches.  All other systems reviewed and are negative.    Allergies  Cymbalta; Ibuprofen; Lisinopril; Lyrica; and Naproxen  Home Medications   Current Outpatient Rx  Name  Route  Sig  Dispense  Refill  . atorvastatin (LIPITOR) 10 MG tablet   Oral   Take 1 tablet (10 mg total) by mouth daily.   90 tablet   1   . cyclobenzaprine (FLEXERIL) 10 MG tablet   Oral   Take 1 tablet (10 mg total) by mouth 2 (two) times daily as needed for muscle spasms.   20 tablet   0   . diazepam (VALIUM) 5 MG tablet   Oral   Take 1 tablet (5 mg total) by mouth 2 (two) times daily.   10 tablet   0   . diclofenac sodium (VOLTAREN) 1 % GEL   Topical   Apply 1 application topically daily as needed. For bone and joint pain         . HYDROcodone-acetaminophen (NORCO/VICODIN) 5-325 MG per tablet   Oral   Take 1 tablet  by mouth every 6 (six) hours as needed for pain.   30 tablet   0   . HYDROcodone-acetaminophen (NORCO/VICODIN) 5-325 MG per tablet   Oral   Take 1 tablet by mouth every 4 (four) hours as needed for moderate pain.   12 tablet   0   . pantoprazole (PROTONIX) 40 MG tablet   Oral   Take 1 tablet (40 mg total) by mouth daily.   30 tablet   3   . sitaGLIPtin-metformin (JANUMET) 50-500 MG per tablet   Oral   Take 1 tablet by mouth 2 (two) times daily with a meal.   60 tablet   3   . traMADol (ULTRAM) 50 MG tablet   Oral   Take 1 tablet (50 mg total) by mouth every 8 (eight) hours as needed for pain.   30 tablet   0   . valsartan (DIOVAN) 320 MG tablet   Oral   Take 1 tablet (320 mg total) by mouth daily.   90 tablet   1    BP 110/74  Pulse 74  Temp(Src) 97.8 F (36.6 C) (Oral)  Resp 18  SpO2 100% Physical Exam  Nursing note and vitals reviewed. Constitutional: She is oriented to person, place, and time. She appears well-developed and well-nourished. No distress.  HENT:  Head: Normocephalic and atraumatic.   Mild swelling and tenderness of her right infraorbital rim without step off.  No tenderness over the maxillary sinus.  No trismus or malocclusion  Eyes: EOM are normal.  Neck: Normal range of motion.  C-spine nontender.  C-spine cleared by Nexus criteria.  Cardiovascular: Normal rate, regular rhythm and normal heart sounds.   Pulmonary/Chest: Effort normal.  Abdominal: She exhibits no distension.  Musculoskeletal: Normal range of motion.  Gait normal. Good strength in arms and legs  Neurological: She is alert and oriented to person, place, and time.  Skin: Skin is warm and dry.  Psychiatric: She has a normal mood and affect. Judgment normal.    ED Course  Procedures (including critical care time) Labs Review Labs Reviewed - No data to display Imaging Review No results found.  EKG Interpretation   None       MDM   1. Concussion, without loss of consciousness, initial encounter    Minor closed head injury. Normal neuro exam. No indication for imaging at this time. Doubt intracranial bleed. Doubt skull fracture. Pt and family given information on head trauma including strict instructions to return for nausea/vomiting, confusion, altered level of consciousness or new weakness. Pt and family understand and are agreeable to the plan     Lyanne Co, MD 10/29/13 [phone]

## 2013-10-29 NOTE — ED Notes (Signed)
Pt was reaching down to to get a candy wrapper and hit her head "hard" on the lightswitch on the wall.  No LOC.  Pt has headache.  Pt is alert and oriented, no weakness or numbness.  Pt is ambulatory to room 5

## 2013-10-29 NOTE — Telephone Encounter (Signed)
Ok for [employer] 350mg  TID prn, #45, no refills

## 2013-10-29 NOTE — Telephone Encounter (Signed)
Patient called and stated that she just left the ED with an concussion. Patient states that she was given Flexeril but states that it doesn't work for her. She would like to know if she could get a refill on [place]. Please advise.SW  Last seen-07/16/2013  Last filled-02/26/2013

## 2013-10-29 NOTE — Telephone Encounter (Signed)
Rx was faxed to [facility] on [address]

## 2013-10-29 NOTE — Telephone Encounter (Signed)
Patient stated that she would like the Rx to go to [facility]. [facility]. Contacted patient to see which pharmacy would she like for it to go to. Left messgae on voicemail to return call to office.

## 2013-11-16 ENCOUNTER — Other Ambulatory Visit: Payer: Self-pay | Admitting: Family Medicine

## 2013-11-30 ENCOUNTER — Ambulatory Visit: Payer: Medicare Other | Admitting: Family Medicine

## 2013-12-06 ENCOUNTER — Emergency Department (HOSPITAL_COMMUNITY)
Admission: EM | Admit: 2013-12-06 | Discharge: 2013-12-06 | Disposition: A | Discharge status: Home / Self Care | Payer: Medicare Other | Attending: Emergency Medicine | Admitting: Emergency Medicine

## 2013-12-06 ENCOUNTER — Encounter (HOSPITAL_COMMUNITY): Payer: Self-pay | Admitting: Emergency Medicine

## 2013-12-06 ENCOUNTER — Other Ambulatory Visit: Payer: Self-pay | Admitting: Family Medicine

## 2013-12-06 ENCOUNTER — Emergency Department (HOSPITAL_COMMUNITY): Payer: Medicare Other

## 2013-12-06 DIAGNOSIS — J45901 Unspecified asthma with (acute) exacerbation: Secondary | ICD-10-CM | POA: Insufficient documentation

## 2013-12-06 DIAGNOSIS — M069 Rheumatoid arthritis, unspecified: Secondary | ICD-10-CM | POA: Insufficient documentation

## 2013-12-06 DIAGNOSIS — K219 Gastro-esophageal reflux disease without esophagitis: Secondary | ICD-10-CM | POA: Insufficient documentation

## 2013-12-06 DIAGNOSIS — M109 Gout, unspecified: Secondary | ICD-10-CM | POA: Insufficient documentation

## 2013-12-06 DIAGNOSIS — I4949 Other premature depolarization: Secondary | ICD-10-CM | POA: Insufficient documentation

## 2013-12-06 DIAGNOSIS — I10 Essential (primary) hypertension: Secondary | ICD-10-CM | POA: Insufficient documentation

## 2013-12-06 DIAGNOSIS — IMO0001 Reserved for inherently not codable concepts without codable children: Secondary | ICD-10-CM | POA: Insufficient documentation

## 2013-12-06 DIAGNOSIS — E119 Type 2 diabetes mellitus without complications: Secondary | ICD-10-CM | POA: Insufficient documentation

## 2013-12-06 DIAGNOSIS — Z87828 Personal history of other (healed) physical injury and trauma: Secondary | ICD-10-CM | POA: Insufficient documentation

## 2013-12-06 DIAGNOSIS — I493 Ventricular premature depolarization: Secondary | ICD-10-CM

## 2013-12-06 DIAGNOSIS — E669 Obesity, unspecified: Secondary | ICD-10-CM | POA: Insufficient documentation

## 2013-12-06 DIAGNOSIS — Z79899 Other long term (current) drug therapy: Secondary | ICD-10-CM | POA: Insufficient documentation

## 2013-12-06 DIAGNOSIS — E785 Hyperlipidemia, unspecified: Secondary | ICD-10-CM | POA: Insufficient documentation

## 2013-12-06 LAB — BASIC METABOLIC PANEL
BUN: 10 mg/dL (ref 6–23)
CHLORIDE: 96 meq/L (ref 96–112)
CO2: 26 mEq/L (ref 19–32)
CREATININE: 0.62 mg/dL (ref 0.50–1.10)
Calcium: 9.3 mg/dL (ref 8.4–10.5)
Glucose, Bld: 235 mg/dL — ABNORMAL HIGH (ref 70–99)
POTASSIUM: 3.6 meq/L — AB (ref 3.7–5.3)
Sodium: 136 mEq/L — ABNORMAL LOW (ref 137–147)

## 2013-12-06 LAB — CBC
HCT: 37 % (ref 36.0–46.0)
Hemoglobin: 12.5 g/dL (ref 12.0–15.0)
MCH: 29.8 pg (ref 26.0–34.0)
MCHC: 33.8 g/dL (ref 30.0–36.0)
MCV: 88.1 fL (ref 78.0–100.0)
PLATELETS: 292 10*3/uL (ref 150–400)
RBC: 4.2 MIL/uL (ref 3.87–5.11)
RDW: 13.5 % (ref 11.5–15.5)
WBC: 14.3 10*3/uL — AB (ref 4.0–10.5)

## 2013-12-06 LAB — I-STAT TROPONIN, ED: Troponin i, poc: 0 ng/mL (ref 0.00–0.08)

## 2013-12-06 NOTE — Discharge Instructions (Signed)
Holter Monitoring A Holter monitor is a small device with electrodes (small sticky patches) that attach to your chest. It records the electrical activity of your heart and is worn continuously for 24-48 hours.  A HOLTER MONITOR IS USED TO  Detect heart problems such as:  Heart arrhythmia. Is an abnormal or irregular heartbeat. With some heart arrhythmias, you may not feel or know that you have an irregular heart rhythm.  Palpitations, such as feeling your heart racing or fluttering. It is possible to have heart palpitations and not have a heart arrhythmia.  A heart rhythm that is too slow or too fast.  If you have problems fainting, near fainting or feeling light-headed, a Holter monitor may be worn to see if your heart is the cause. HOLTER MONITOR PREPARATION   Electrodes will be attached to the skin on your chest.  If you have hair on your chest, small areas may have to be shaved. This is done to help the patches stick better and make the recording more accurate.  The electrodes are attached by wires to the Holter monitor. The Holter monitor clips to your clothing. You will wear the monitor at all times, even while exercising and sleeping. HOME CARE INSTRUCTIONS   Wear your monitor at all times.  The wires and the monitor must stay dry. Do not get the monitor wet.  Do not bathe, swim or use a hot tub with it on.  You may do a "sponge" bath while you have the monitor on.  Keep your skin clean, do not put body lotion or moisturizer on your chest.  It's possible that your skin under the electrodes could become irritated. To keep this from happening, you may put the electrodes in slightly different places on your chest.  Your caregiver will also ask you to keep a diary of your activities, such as walking or doing chores. Be sure to note what you are doing if you experience heart symptoms such as palpitations. This will help your caregiver determine what might be contributing to your  symptoms. The information stored in your monitor will be reviewed by your caregiver alongside your diary entries.  Make sure the monitor is safely clipped to your clothing or in a location close to your body that your caregiver recommends.  The monitor and electrodes are removed when the test is over. Return the monitor as directed.  Be sure to follow up with your caregiver and discuss your Holter monitor results. SEEK IMMEDIATE MEDICAL CARE IF:  You faint or feel lightheaded.  You have trouble breathing.  You get pain in your chest, upper arm or jaw.  You feel sick to your stomach and your skin is pale, cool, or damp.  You think something is wrong with the way your heart is beating. MAKE SURE YOU:   Understand these instructions.  Will watch your condition.  Will get help right away if you are not doing well or get worse. Document Released: 07/02/2004 Document Revised: 12/27/2011 Document Reviewed: 11/14/2008 [facility] Patient Information 2014 [place], [place].  Premature Beats A premature beat is an extra heartbeat that happens earlier than normal. Premature beats are called premature atrial contractions (PACs) or premature ventricular contractions (PVCs) depending on the area of the heart where they start. CAUSES  Premature beats may be brought on by a variety of factors including:  Emotional stress.  Lack of sleep.  Caffeine.  Asthma medicines.  Stimulants.  Herbal teas.  Dietary supplements.  Alcohol. In most cases,  premature beats are not dangerous and are not a sign of serious heart disease. Most patients evaluated for premature beats have completely normal heart function. Rarely, premature beats may be a sign of more significant heart problems or medical illness. SYMPTOMS  Premature beats may cause palpitations. This means you feel like your heart is skipping a beat or beating harder than usual. Sometimes, slight chest pain occurs with premature beats, lasting  only a few seconds. This pain has been described as a "flopping" feeling inside the chest. In many cases, premature beats do not cause any symptoms and they are only detected when an electrocardiography test (EKG) or heart monitoring is performed. DIAGNOSIS  Your caregiver may run some tests to evaluate your heart such as an EKG or echocardiography. You may need to wear a portable heart monitor for several days to record the electrical activity of your heart. Blood testing may also be performed to check your electrolytes and thyroid function. TREATMENT  Premature beats usually go away with rest. If the problem continues, your caregiver will determine a treatment plan for you.  HOME CARE INSTRUCTIONS  Get plenty of rest over the next few days until your symptoms improve.  Avoid coffee, tea, alcohol, and soda (pop, cola).  Do not smoke. SEEK MEDICAL CARE IF:  Your symptoms continue after 1 to 2 days of rest.  You have new symptoms, such as chest pain or trouble breathing. SEEK IMMEDIATE MEDICAL CARE IF:  You have severe chest pain or abdominal pain.  You have pain that radiates into the neck, arm, or jaw.  You faint or have extreme weakness.  You have shortness of breath.  Your heartbeat races for more than 5 seconds. MAKE SURE YOU:  Understand these instructions.  Will watch your condition.  Will get help right away if you are not doing well or get worse. Document Released: 11/11/2004 Document Revised: 12/27/2011 Document Reviewed: 06/07/2011 [facility] Patient Information 2014 [place], [place].

## 2013-12-06 NOTE — ED Notes (Signed)
Pt states that yesterday she noticed her heart with beat normal then beat really hard then pause then start back. Pt c/o shob.

## 2013-12-06 NOTE — ED Provider Notes (Signed)
CSN: [id number]     Arrival date & time 12/06/13  1734 History   First MD Initiated Contact with Patient 12/06/13 1816     Chief Complaint  Patient presents with  . Palpitations     (Consider location/radiation/quality/duration/timing/severity/associated sxs/prior Treatment) Patient is a 38 y.o. female presenting with palpitations. The history is provided by the patient.  Palpitations Palpitations quality:  Regular Onset quality:  Sudden Timing:  Intermittent Progression:  Unchanged Chronicity:  New Context: not anxiety, not appetite suppressants, not caffeine, not exercise, not hyperventilation, not illicit drugs, not nicotine and not stimulant use   Relieved by:  Nothing Worsened by:  Nothing tried Ineffective treatments:  None tried Associated symptoms: chest pain (2 brief (<5 second) twinges of chest pain today. No radiation, no instigating or alleviating/exacerbating factors. No prior CP) and shortness of breath (very brief episodes (<5 seconds) with the palpitations)   Associated symptoms: no back pain, no nausea and no vomiting     Past Medical History  Diagnosis Date  . Asthma   . Arthritis   . Diabetes mellitus     uses natural ERBS  . GERD (gastroesophageal reflux disease)   . Gout   . Pulmonary hypertension   . Allergy   . Head injury 2002  . PCOS (polycystic ovarian syndrome)   . Fibromyalgia   . Obesity   . Unspecified essential hypertension   . Arthritis, rheumatoid   . Hyperlipidemia    Past Surgical History  Procedure Laterality Date  . No past surgeries     Family History  Problem Relation Age of Onset  . Hypertension Mother   . Heart disease Mother   . Rheum arthritis Mother   . Diabetes Brother   . Hypertension Brother   . Lung cancer Maternal Uncle   . Breast cancer Maternal Aunt   . Heart disease Father   . Hypertension Father   . Heart disease Paternal Grandfather    History  Substance Use Topics  . Smoking status: Never Smoker   .  Smokeless tobacco: Never Used  . Alcohol Use: No   OB History   Grav Para Term Preterm Abortions TAB SAB Ect Mult Living   0              Review of Systems  Respiratory: Positive for shortness of breath (very brief episodes (<5 seconds) with the palpitations).   Cardiovascular: Positive for chest pain (2 brief (<5 second) twinges of chest pain today. No radiation, no instigating or alleviating/exacerbating factors. No prior CP) and palpitations.  Gastrointestinal: Negative for nausea and vomiting.  Musculoskeletal: Negative for back pain.  All other systems reviewed and are negative.      Allergies  Cymbalta; Ibuprofen; Lisinopril; Lyrica; and Naproxen  Home Medications   Current Outpatient Rx  Name  Route  Sig  Dispense  Refill  . carisoprodol (SOMA) 350 MG tablet      TAKE 1 TABLET BY MOUTH THREE TIMES DAILY   45 tablet   0   . diclofenac sodium (VOLTAREN) 1 % GEL   Topical   Apply 1 application topically daily as needed. For bone and joint pain         . diphenhydramine-acetaminophen (TYLENOL PM EXTRA STRENGTH) 25-500 MG TABS   Oral   Take 1 tablet by mouth at bedtime as needed (pain).         Marland Kitchen HYDROcodone-acetaminophen (NORCO/VICODIN) 5-325 MG per tablet   Oral   Take 1 tablet by mouth every  4 (four) hours as needed for moderate pain.   12 tablet   0   . omeprazole (PRILOSEC) 20 MG capsule   Oral   Take 20 mg by mouth daily.         . valsartan (DIOVAN) 320 MG tablet   Oral   Take 1 tablet (320 mg total) by mouth daily.   90 tablet   1    BP 160/86  Pulse 100  Temp(Src) 98.3 F (36.8 C) (Oral)  Resp 20  SpO2 99% Physical Exam  Nursing note and vitals reviewed. Constitutional: She is oriented to person, place, and time. She appears well-developed and well-nourished. No distress.  HENT:  Head: Normocephalic and atraumatic.  Eyes: EOM are normal. Pupils are equal, round, and reactive to light.  Neck: Normal range of motion. Neck supple.   Cardiovascular: Normal rate and regular rhythm.  Exam reveals no friction rub.   No murmur heard. Pulmonary/Chest: Effort normal and breath sounds normal. No respiratory distress. She has no wheezes. She has no rales.  Abdominal: Soft. She exhibits no distension. There is no tenderness. There is no rebound.  Musculoskeletal: Normal range of motion. She exhibits no edema.  Neurological: She is alert and oriented to person, place, and time.  Skin: She is not diaphoretic.    ED Course  Procedures (including critical care time) Labs Review Labs Reviewed  CBC - Abnormal; Notable for the following:    WBC 14.3 (*)    All other components within normal limits  BASIC METABOLIC PANEL - Abnormal; Notable for the following:    Sodium 136 (*)    Potassium 3.6 (*)    Glucose, Bld 235 (*)    All other components within normal limits  TSH  I-STAT TROPOININ, ED   Imaging Review Dg Chest 2 View  12/06/2013   CLINICAL DATA:  38 year old female with chest pain shortness of breath palpitations. Initial encounter. Diabetes.  EXAM: CHEST  2 VIEW  COMPARISON:  Chest CTA 04/04/2013 and earlier.  FINDINGS: Stable and normal lung volumes. Large body habitus. Normal cardiac size and mediastinal contours. Visualized tracheal air column is within normal limits. Lung parenchyma stable and clear. No pneumothorax or effusion. No acute osseous abnormality identified.  IMPRESSION: Negative, no acute cardiopulmonary abnormality.   Electronically Signed   By: Augusto Gamble M.D.   On: 12/06/2013 19:33    EKG Interpretation   None       Date: 12/06/2013  Rate: 97  Rhythm: normal sinus rhythm  QRS Axis: normal  Intervals: normal  ST/T Wave abnormalities: normal  Conduction Disutrbances:none  Narrative Interpretation:   Old EKG Reviewed: unchanged   MDM   Final diagnoses:  PVC's (premature ventricular contractions)    36F here with palpitations. No rapid heartbeat. Describes palpitations as PVCs. No  persistent CP or SOB. No fevers. No stimulants, new drugs, stress, anxiety. Recent head injury.  Exam benign. EKG normal. Will watch on the monitor and get labs.  Monitor shows multiple PVCs. Labs normal. Patient stable for discharge. Sent a message to Dr. Beverely Low to help facilitate f/u for Holter monitor.   Dagmar Hait, MD 12/06/13 [phone]

## 2013-12-06 NOTE — Telephone Encounter (Signed)
Last OV 06/2013 Med last filled 11/16/13 #45 with 0   NO CSC on file

## 2013-12-07 LAB — TSH: TSH: 3.073 u[IU]/mL (ref 0.350–4.500)

## 2013-12-20 ENCOUNTER — Telehealth: Payer: Self-pay

## 2013-12-20 ENCOUNTER — Other Ambulatory Visit: Payer: Self-pay | Admitting: Family Medicine

## 2013-12-20 NOTE — Telephone Encounter (Addendum)
Patient went to the ED on 12/06/13.  Patient states that "my heart beat was abnormal, it would beat normally and then a big boom and with each big heart beat, I couldn't breath well."   She also stated that she had some chest pressure in the middle of her back and chest during this time.  Patient stated symptoms started after eating dark chocolate.  Patient stated that she also drinks a large cup of tea approximate 5-6 days per week, but other than that she doesn't consume caffeine. They treated her for heart palpitations.  EKG showed PVCs.  Lab work was normal per patient.  Patient was discharged and encouraged to follow up with her provider within 4 days.  She was also encouraged to have holter monitor ordered to monitor heart.    Patient stated that she tried calling the next business day after discharge and was not able to get through.   The phones would ring and then go to voicemail.  Patient stated she tried other times, to no avail.  After then, she stated she forgot to call back due to other things going on and her recent fall.  Patient fell in the front of her yard 3-4 days ago on cords that were present during the time her carpet was being cleaned.  She tripped "really hard" over the cords and since then has been sore all over.  Over the past couple of weeks,  patient has not experienced any chest pain, but has had some chest pressure and shoulder, arm and neck pain over the past 4 days.  Patient shared that it has been hard for her to determine whether chest pressure and pain has been coming from her heart or from recent fall.   Patient states that she is still experiences some heart palpitations, states she no longer can feel them, but can hear it slightly and it sounds regular (before, she could feel and hear them strongly).     Today (12/20/13) patient is not experiencing any chest pain, jaw pain, abdominal pain, no pain down Left arm, no dizziness, no diaphoresis (did experience some 3 days ago)  or fatigue.   Chest pressure, shoulder, neck and arm pain still present.  Rated 8/10.    Patient was strongly advised to go to the ED, just for precaution.  Patient stated that she would rather be seen by Dr. Beverely Low.  It was stressed that if patient's condition worsen, to please report to the ED.    Appt was scheduled for 12/21/13 at 11:30.     Medication and allergies:  Reviewed and updated Local pharmacy:  [id number] - [place], [place] - [address] AT [facility] [address] No changes to personal, family history or past surgical hx

## 2013-12-21 ENCOUNTER — Encounter: Payer: Self-pay | Admitting: Family Medicine

## 2013-12-21 ENCOUNTER — Ambulatory Visit (INDEPENDENT_AMBULATORY_CARE_PROVIDER_SITE_OTHER): Payer: Medicare Other | Admitting: Family Medicine

## 2013-12-21 VITALS — BP 150/88 | HR 63 | Temp 98.2°F | Resp 16 | Wt >= 6400 oz

## 2013-12-21 DIAGNOSIS — E785 Hyperlipidemia, unspecified: Secondary | ICD-10-CM

## 2013-12-21 DIAGNOSIS — I4949 Other premature depolarization: Secondary | ICD-10-CM

## 2013-12-21 DIAGNOSIS — I10 Essential (primary) hypertension: Secondary | ICD-10-CM

## 2013-12-21 DIAGNOSIS — M25561 Pain in right knee: Secondary | ICD-10-CM

## 2013-12-21 DIAGNOSIS — I493 Ventricular premature depolarization: Secondary | ICD-10-CM

## 2013-12-21 DIAGNOSIS — E119 Type 2 diabetes mellitus without complications: Secondary | ICD-10-CM

## 2013-12-21 DIAGNOSIS — M25569 Pain in unspecified knee: Secondary | ICD-10-CM

## 2013-12-21 DIAGNOSIS — M542 Cervicalgia: Secondary | ICD-10-CM

## 2013-12-21 DIAGNOSIS — M25519 Pain in unspecified shoulder: Secondary | ICD-10-CM

## 2013-12-21 LAB — LIPID PANEL
CHOL/HDL RATIO: 5
Cholesterol: 195 mg/dL (ref 0–200)
HDL: 40.2 mg/dL (ref >39.00)
LDL CALC: 134 mg/dL — AB (ref 0–99)
TRIGLYCERIDES: 103 mg/dL (ref 0.0–149.0)
VLDL: 20.6 mg/dL (ref 0.0–40.0)

## 2013-12-21 LAB — CBC WITH DIFFERENTIAL/PLATELET
BASOS ABS: 0 10*3/uL (ref 0.0–0.1)
BASOS PCT: 0.4 % (ref 0.0–3.0)
EOS ABS: 0.1 10*3/uL (ref 0.0–0.7)
Eosinophils Relative: 0.7 % (ref 0.0–5.0)
HCT: 34.4 % — ABNORMAL LOW (ref 36.0–46.0)
HEMOGLOBIN: 11.1 g/dL — AB (ref 12.0–15.0)
LYMPHS PCT: 24.1 % (ref 12.0–46.0)
Lymphs Abs: 2.6 10*3/uL (ref 0.7–4.0)
MCHC: 32.2 g/dL (ref 30.0–36.0)
MCV: 89.6 fl (ref 78.0–100.0)
MONOS PCT: 3.7 % (ref 3.0–12.0)
Monocytes Absolute: 0.4 10*3/uL (ref 0.1–1.0)
NEUTROS ABS: 7.7 10*3/uL (ref 1.4–7.7)
NEUTROS PCT: 71.1 % (ref 43.0–77.0)
Platelets: 235 10*3/uL (ref 150.0–400.0)
RBC: 3.84 Mil/uL — AB (ref 3.87–5.11)
RDW: 14.5 % (ref 11.5–14.6)
WBC: 10.9 10*3/uL — ABNORMAL HIGH (ref 4.5–10.5)

## 2013-12-21 LAB — HEPATIC FUNCTION PANEL
ALBUMIN: 3.7 g/dL (ref 3.5–5.2)
ALK PHOS: 54 U/L (ref 39–117)
ALT: 22 U/L (ref 0–35)
AST: 20 U/L (ref 0–37)
BILIRUBIN DIRECT: 0 mg/dL (ref 0.0–0.3)
BILIRUBIN TOTAL: 0.5 mg/dL (ref 0.3–1.2)
TOTAL PROTEIN: 7.3 g/dL (ref 6.0–8.3)

## 2013-12-21 LAB — BASIC METABOLIC PANEL
BUN: 6 mg/dL (ref 6–23)
CALCIUM: 9 mg/dL (ref 8.4–10.5)
CO2: 26 meq/L (ref 19–32)
CREATININE: 0.7 mg/dL (ref 0.4–1.2)
Chloride: 104 mEq/L (ref 96–112)
GFR: 128.43 mL/min (ref >60.00)
Glucose, Bld: 105 mg/dL — ABNORMAL HIGH (ref 70–99)
Potassium: 4 mEq/L (ref 3.5–5.1)
Sodium: 139 mEq/L (ref 135–145)

## 2013-12-21 LAB — HEMOGLOBIN A1C: HEMOGLOBIN A1C: 8.8 % — AB (ref 4.6–6.5)

## 2013-12-21 MED ORDER — CARISOPRODOL 350 MG PO TABS: ORAL_TABLET | ORAL | Status: DC

## 2013-12-21 MED ORDER — DICLOFENAC SODIUM 1 % TD GEL: 1.0000 "application " | Freq: Four times a day (QID) | TRANSDERMAL | Status: AC

## 2013-12-21 NOTE — Progress Notes (Signed)
   Subjective:    Patient ID: Kelsey Schmitt, female    DOB: Jul 16, 1976, 38 y.o.   MRN: [id number]  HPI Hospital F/U- pt was seen in ED for palpitations.  Recent head injury and dx'd w/ concussion.  A few weeks ago reported regular heart beats and 'then BOOM'.  Would have associated SOB w/ each 'boom'.  dx'd in ER w/ PVCs.  No evidence of cardiac injury/event.  Plan was for pt to get Holter monitor.  Pt reports she is still having them but they are less pronounced than previous.  BP is elevated today but pt just took med prior to arrival.  Pt also having intermittent R sided chest pressure, located in upper breast.  Also had a fall earlier this week while going up the steps and hit chest- did not hit head.  Now having pain in R shoulder and neck along w/ R knee.  Previously had PT at [facility] and felt 'a lot better'.  DM- overdue on labs, hx of noncompliance.   Review of Systems For ROS see HPI     Objective:   Physical Exam  Vitals reviewed. Constitutional: She is oriented to person, place, and time. She appears well-developed and well-nourished. No distress.  Morbidly obese  HENT:  Head: Normocephalic and atraumatic.  Eyes: Conjunctivae and EOM are normal. Pupils are equal, round, and reactive to light.  Neck: Normal range of motion. Neck supple. No thyromegaly present.  Cardiovascular: Normal rate, regular rhythm, normal heart sounds and intact distal pulses.   No murmur heard. Pulmonary/Chest: Effort normal and breath sounds normal. No respiratory distress. She exhibits tenderness (over R superior breast tissue).  Abdominal: Soft. She exhibits no distension. There is no tenderness.  Musculoskeletal: She exhibits no edema.  Lymphadenopathy:    She has no cervical adenopathy.  Neurological: She is alert and oriented to person, place, and time. No cranial nerve deficit. Coordination normal.  Skin: Skin is warm and dry.  Psychiatric: She has a normal mood and affect. Her behavior  is normal.  Thought process is difficult to follow, almost tangential          Assessment & Plan:

## 2013-12-21 NOTE — Patient Instructions (Signed)
We'll call you to set up the Holter Monitor and PT appt We'll notify you of your lab results and make any changes if needed Wear a good supportive bra Use a heating pad as needed for neck/shoulder If you continue to have pain- call your pain management doctor Call with any questions or concerns Hang in there!!!

## 2013-12-21 NOTE — Progress Notes (Signed)
Pre visit review using our clinic review tool, if applicable. No additional management support is needed unless otherwise documented below in the visit note. 

## 2013-12-21 NOTE — Telephone Encounter (Signed)
Spoke with pt who advised that the pain had been on the right side/center of chest more like a breast pain. Pt advised that she is not currently having any pain. Pt was advised that if the pain comes back any at all she MUST go to the ER. If not then she is ok to come into the office today.   Pt agreed to seek treatment at the ER if any pain returns before her OV.

## 2013-12-21 NOTE — Telephone Encounter (Signed)
Pt has multiple risk factors for a coronary event- morbid obesity, pulmonary HTN, hyperlipidemia, DM, HTN.  Chest pressure, shoulder, neck and arm pain rated 8/10 is VERY concerning in this situation.  Is the pain/pressure R or L sided.  If it is L sided she MUST go to the ED.  If R sided, we can see her at 11:30 unless her symptoms change or worsen- and then again, she must go to the ER.

## 2013-12-22 ENCOUNTER — Telehealth: Payer: Self-pay | Admitting: Family Medicine

## 2013-12-22 NOTE — Telephone Encounter (Signed)
Relevant patient education assigned to patient using Emmi. ° °

## 2013-12-23 NOTE — Assessment & Plan Note (Signed)
Chronic problem.  Pt not currently on statin (reason unclear).  Check labs.  Restart meds prn.  Pt expressed understanding and is in agreement w/ plan.

## 2013-12-23 NOTE — Assessment & Plan Note (Signed)
Not well controlled today but pt reports she took meds just prior to arrival.  Stressed the need to take medications regularly.  Will monitor.

## 2013-12-23 NOTE — Assessment & Plan Note (Signed)
Chronic problem for pt.  Hx of noncompliance.  Overdue on eye exam, encouraged her to schedule.  Not currently on meds (again, reason unclear).  Check labs.  Adjust meds prn

## 2013-12-23 NOTE — Assessment & Plan Note (Signed)
New.  Pt w/ documented PVCs in ER and her sxs are consistent w/ this.  Has not had any recently (last few days) and is asymptomatic here in office.  Will arrange for holter monitor.  Pt expressed understanding and is in agreement w/ plan.

## 2013-12-23 NOTE — Assessment & Plan Note (Signed)
New.  Unclear if this is related to pt's known fibromyalgia, previous head/neck injuries, or recent fall.  Nothing acute on PE today.  Refer back to PT as pt reports she felt better after this.  Will follow.

## 2013-12-23 NOTE — Assessment & Plan Note (Signed)
Recurrent problem for pt.  Refer back to PT

## 2013-12-24 ENCOUNTER — Encounter: Payer: Self-pay | Admitting: Family Medicine

## 2013-12-24 ENCOUNTER — Other Ambulatory Visit: Payer: Self-pay | Admitting: General Practice

## 2013-12-24 ENCOUNTER — Telehealth: Payer: Self-pay | Admitting: Family Medicine

## 2013-12-24 ENCOUNTER — Encounter: Payer: Self-pay | Admitting: General Practice

## 2013-12-24 MED ORDER — ATORVASTATIN CALCIUM 10 MG PO TABS
10.0000 mg | ORAL_TABLET | Freq: Every day | ORAL | Status: DC
Start: 2013-12-24 — End: 2019-05-30

## 2013-12-24 MED ORDER — METFORMIN HCL ER 750 MG PO TB24: 750.0000 mg | ORAL_TABLET | Freq: Every day | ORAL | Status: DC

## 2013-12-24 NOTE — Telephone Encounter (Signed)
Relevant patient education assigned to patient using Emmi. ° °

## 2013-12-25 ENCOUNTER — Other Ambulatory Visit: Payer: Self-pay | Admitting: Family Medicine

## 2013-12-25 DIAGNOSIS — E282 Polycystic ovarian syndrome: Secondary | ICD-10-CM

## 2013-12-26 ENCOUNTER — Telehealth: Payer: Self-pay

## 2013-12-26 ENCOUNTER — Encounter (INDEPENDENT_AMBULATORY_CARE_PROVIDER_SITE_OTHER): Payer: Medicare Other

## 2013-12-26 ENCOUNTER — Encounter: Payer: Self-pay | Admitting: *Deleted

## 2013-12-26 DIAGNOSIS — I493 Ventricular premature depolarization: Secondary | ICD-10-CM

## 2013-12-26 DIAGNOSIS — I4949 Other premature depolarization: Secondary | ICD-10-CM

## 2013-12-26 NOTE — Telephone Encounter (Signed)
Relevant patient education assigned to patient using Emmi. ° °

## 2013-12-26 NOTE — Progress Notes (Signed)
Patient ID: Kelsey Schmitt, female   DOB: 22-Oct-1975, 38 y.o.   MRN: [id number] EVO 48 hour holter monitor applied to patient.

## 2014-01-05 ENCOUNTER — Encounter: Payer: Self-pay | Admitting: Family Medicine

## 2014-01-08 ENCOUNTER — Telehealth: Payer: Self-pay | Admitting: *Deleted

## 2014-01-08 NOTE — Telephone Encounter (Signed)
PA order #: [id number]

## 2014-01-08 NOTE — Telephone Encounter (Signed)
Spoke with prior authorization representative at [employer] concerning PA for [employer]. Urgent response requested. Representative stated that we would receive a determination within 24 hours. JG//CMA

## 2014-01-10 ENCOUNTER — Telehealth: Payer: Self-pay | Admitting: *Deleted

## 2014-01-22 ENCOUNTER — Telehealth: Payer: Self-pay | Admitting: General Practice

## 2014-01-22 NOTE — Telephone Encounter (Signed)
Called pt in regards to Holter monitor report. Report states no arrhythmias. No treatment needed at this time. Left message for pt to return call. Paperwork sent to scanning.

## 2014-01-22 NOTE — Telephone Encounter (Signed)
Spoke with patient and made aware of Holter results.

## 2014-02-14 NOTE — Telephone Encounter (Signed)
x

## 2014-03-28 ENCOUNTER — Telehealth: Payer: Self-pay | Admitting: Family Medicine

## 2014-03-28 MED ORDER — CARISOPRODOL 350 MG PO TABS: ORAL_TABLET | ORAL | Status: DC

## 2014-03-28 MED ORDER — VALSARTAN 320 MG PO TABS: 320.0000 mg | ORAL_TABLET | Freq: Every day | ORAL | Status: DC

## 2014-03-28 NOTE — Telephone Encounter (Signed)
Last OV 12-21-13  Hydrocodone filled 10-29-13 #12 with 0 Soma filled 12-21-13 #60 with 3 diovan 09-06-13 #90 with 1  Chart states DPR on file, however cannot find record of it. Please advise if ok to send diovan and soma to [place]. Advise on what to do for hydrocodone, if we fill it doesn;t it void her contract with pain management?

## 2014-03-28 NOTE — Telephone Encounter (Signed)
Med filled.  

## 2014-03-28 NOTE — Telephone Encounter (Signed)
Ok those meds, we should only be filling Diovan.  The other 2 should come from Pain Management b/c if we fill them, this voids her treatment plan w/ them.

## 2014-03-28 NOTE — Telephone Encounter (Signed)
Ok to refill Soma

## 2014-03-28 NOTE — Telephone Encounter (Signed)
Pt states that you have been filling soma, last refill has your name on it. States she has never received this from pain management.

## 2014-03-28 NOTE — Telephone Encounter (Signed)
Caller name: Latrece Nitta Relation to pt: patient Call back number: [phone] Pharmacy: [facility]  [address] [place] [address] [phone]  Reason for call: Patient called to request refills for Diovan and Soma. Also patient states that she is in [place] until July and needs a refill on her Hydrocodone too. Patient is aware that dr Beverely Low does not prescribe her pain medicine but she has not been able to make her apt with pain management. Please advise.

## 2014-05-03 ENCOUNTER — Telehealth: Payer: Self-pay

## 2014-05-03 DIAGNOSIS — E785 Hyperlipidemia, unspecified: Secondary | ICD-10-CM

## 2014-05-03 DIAGNOSIS — E119 Type 2 diabetes mellitus without complications: Secondary | ICD-10-CM

## 2014-05-03 NOTE — Telephone Encounter (Signed)
Diabetic Bundle  mychart message sent  Pt needs appt for BP recheck, A1C and lipid panel  Labs ordered

## 2014-05-29 ENCOUNTER — Other Ambulatory Visit: Payer: Self-pay | Admitting: Family Medicine

## 2014-05-30 ENCOUNTER — Other Ambulatory Visit: Payer: Self-pay | Admitting: Family Medicine

## 2014-05-30 NOTE — Telephone Encounter (Signed)
Med filled.  

## 2014-09-27 ENCOUNTER — Other Ambulatory Visit: Payer: Self-pay | Admitting: Family Medicine

## 2014-09-27 NOTE — Telephone Encounter (Signed)
Med denied, pt needs an appt. Pt has not been on meds in 2 months.

## 2014-11-22 ENCOUNTER — Other Ambulatory Visit: Payer: Self-pay | Admitting: Family Medicine

## 2014-11-22 NOTE — Telephone Encounter (Signed)
Med filled.  

## 2019-05-15 ENCOUNTER — Encounter (HOSPITAL_BASED_OUTPATIENT_CLINIC_OR_DEPARTMENT_OTHER): Payer: Self-pay | Admitting: *Deleted

## 2019-05-15 ENCOUNTER — Emergency Department (HOSPITAL_BASED_OUTPATIENT_CLINIC_OR_DEPARTMENT_OTHER)
Admission: EM | Admit: 2019-05-15 | Discharge: 2019-05-15 | Disposition: A | Discharge status: Home / Self Care | Payer: Medicare Other | Attending: Emergency Medicine | Admitting: Emergency Medicine

## 2019-05-15 ENCOUNTER — Emergency Department (HOSPITAL_BASED_OUTPATIENT_CLINIC_OR_DEPARTMENT_OTHER): Payer: Medicare Other

## 2019-05-15 ENCOUNTER — Other Ambulatory Visit: Payer: Self-pay

## 2019-05-15 DIAGNOSIS — W228XXA Striking against or struck by other objects, initial encounter: Secondary | ICD-10-CM | POA: Diagnosis not present

## 2019-05-15 DIAGNOSIS — Y998 Other external cause status: Secondary | ICD-10-CM | POA: Diagnosis not present

## 2019-05-15 DIAGNOSIS — E1165 Type 2 diabetes mellitus with hyperglycemia: Secondary | ICD-10-CM | POA: Insufficient documentation

## 2019-05-15 DIAGNOSIS — S0990XA Unspecified injury of head, initial encounter: Secondary | ICD-10-CM | POA: Diagnosis present

## 2019-05-15 DIAGNOSIS — Y9289 Other specified places as the place of occurrence of the external cause: Secondary | ICD-10-CM | POA: Diagnosis not present

## 2019-05-15 DIAGNOSIS — Y9389 Activity, other specified: Secondary | ICD-10-CM | POA: Insufficient documentation

## 2019-05-15 DIAGNOSIS — S060X0A Concussion without loss of consciousness, initial encounter: Secondary | ICD-10-CM | POA: Diagnosis not present

## 2019-05-15 DIAGNOSIS — Z79899 Other long term (current) drug therapy: Secondary | ICD-10-CM | POA: Diagnosis not present

## 2019-05-15 DIAGNOSIS — R739 Hyperglycemia, unspecified: Secondary | ICD-10-CM

## 2019-05-15 DIAGNOSIS — S0083XA Contusion of other part of head, initial encounter: Secondary | ICD-10-CM | POA: Diagnosis not present

## 2019-05-15 DIAGNOSIS — S161XXA Strain of muscle, fascia and tendon at neck level, initial encounter: Secondary | ICD-10-CM | POA: Diagnosis not present

## 2019-05-15 DIAGNOSIS — J45909 Unspecified asthma, uncomplicated: Secondary | ICD-10-CM | POA: Insufficient documentation

## 2019-05-15 DIAGNOSIS — E785 Hyperlipidemia, unspecified: Secondary | ICD-10-CM | POA: Diagnosis not present

## 2019-05-15 LAB — CBC WITH DIFFERENTIAL/PLATELET
Abs Immature Granulocytes: 0.06 10*3/uL (ref 0.00–0.07)
Basophils Absolute: 0.1 10*3/uL (ref 0.0–0.1)
Basophils Relative: 0 %
Eosinophils Absolute: 0.1 10*3/uL (ref 0.0–0.5)
Eosinophils Relative: 1 %
HCT: 36.5 % (ref 36.0–46.0)
Hemoglobin: 11.8 g/dL — ABNORMAL LOW (ref 12.0–15.0)
Immature Granulocytes: 1 %
Lymphocytes Relative: 34 %
Lymphs Abs: 4 10*3/uL (ref 0.7–4.0)
MCH: 29.5 pg (ref 26.0–34.0)
MCHC: 32.3 g/dL (ref 30.0–36.0)
MCV: 91.3 fL (ref 80.0–100.0)
Monocytes Absolute: 0.5 10*3/uL (ref 0.1–1.0)
Monocytes Relative: 4 %
Neutro Abs: 7.1 10*3/uL (ref 1.7–7.7)
Neutrophils Relative %: 60 %
Platelets: 263 10*3/uL (ref 150–400)
RBC: 4 MIL/uL (ref 3.87–5.11)
RDW: 12.5 % (ref 11.5–15.5)
WBC: 11.8 10*3/uL — ABNORMAL HIGH (ref 4.0–10.5)
nRBC: 0 % (ref 0.0–0.2)

## 2019-05-15 LAB — COMPREHENSIVE METABOLIC PANEL
ALT: 12 U/L (ref 0–44)
AST: 14 U/L — ABNORMAL LOW (ref 15–41)
Albumin: 3.5 g/dL (ref 3.5–5.0)
Alkaline Phosphatase: 60 U/L (ref 38–126)
Anion gap: 8 (ref 5–15)
BUN: 6 mg/dL (ref 6–20)
CO2: 26 mmol/L (ref 22–32)
Calcium: 8.5 mg/dL — ABNORMAL LOW (ref 8.9–10.3)
Chloride: 103 mmol/L (ref 98–111)
Creatinine, Ser: 0.58 mg/dL (ref 0.44–1.00)
GFR calc Af Amer: >60 mL/min (ref >60)
GFR calc non Af Amer: >60 mL/min (ref >60)
Glucose, Bld: 170 mg/dL — ABNORMAL HIGH (ref 70–99)
Potassium: 3.8 mmol/L (ref 3.5–5.1)
Sodium: 137 mmol/L (ref 135–145)
Total Bilirubin: 0.5 mg/dL (ref 0.3–1.2)
Total Protein: 6.8 g/dL (ref 6.5–8.1)

## 2019-05-15 MED ORDER — MORPHINE SULFATE (PF) 4 MG/ML IV SOLN
4.0000 mg | Freq: Once | INTRAVENOUS | Status: AC
  Administered 2019-05-15: 4 mg via INTRAVENOUS
  Filled 2019-05-15: qty 1

## 2019-05-15 MED ORDER — ONDANSETRON HCL 4 MG/2ML IJ SOLN
4.0000 mg | Freq: Once | INTRAMUSCULAR | Status: AC
  Administered 2019-05-15: 4 mg via INTRAVENOUS
  Filled 2019-05-15: qty 2

## 2019-05-15 MED ORDER — SODIUM CHLORIDE 0.9 % IV BOLUS
1000.0000 mL | Freq: Once | INTRAVENOUS | Status: AC
  Administered 2019-05-15: 15:00:00 1000 mL via INTRAVENOUS

## 2019-05-15 MED ORDER — HYDROMORPHONE HCL 1 MG/ML IJ SOLN
1.0000 mg | Freq: Once | INTRAMUSCULAR | Status: AC
  Administered 2019-05-15: 1 mg via INTRAVENOUS
  Filled 2019-05-15: qty 1

## 2019-05-15 MED ORDER — PROMETHAZINE HCL 25 MG/ML IJ SOLN
12.5000 mg | Freq: Once | INTRAMUSCULAR | Status: AC
  Administered 2019-05-15: 12.5 mg via INTRAVENOUS
  Filled 2019-05-15: qty 1

## 2019-05-15 MED ORDER — ONDANSETRON 4 MG PO TBDP: 4.0000 mg | ORAL_TABLET | Freq: Three times a day (TID) | ORAL | 0 refills | Status: AC | PRN

## 2019-05-15 MED ORDER — HYDROCODONE-ACETAMINOPHEN 5-325 MG PO TABS: 1.0000 | ORAL_TABLET | ORAL | 0 refills | Status: DC | PRN

## 2019-05-15 NOTE — ED Notes (Signed)
ED Provider at bedside. 

## 2019-05-15 NOTE — ED Triage Notes (Signed)
Hx of head injury last week. Last Sunday and Tuesday same injury. She hit her head on her trunk lid. She is here for "cracking" in her neck, headache, right eye swelling, nausea and glaring lights. She is ambulatory. Alert oriented.
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
## 2019-05-15 NOTE — ED Notes (Signed)
Pt reports feeling difficulty getting words out

## 2019-05-30 ENCOUNTER — Other Ambulatory Visit: Payer: Self-pay

## 2019-05-30 ENCOUNTER — Emergency Department (HOSPITAL_BASED_OUTPATIENT_CLINIC_OR_DEPARTMENT_OTHER): Payer: Medicare Other

## 2019-05-30 ENCOUNTER — Emergency Department (HOSPITAL_BASED_OUTPATIENT_CLINIC_OR_DEPARTMENT_OTHER)
Admission: EM | Admit: 2019-05-30 | Discharge: 2019-05-30 | Disposition: A | Discharge status: Home / Self Care | Payer: Medicare Other | Attending: Emergency Medicine | Admitting: Emergency Medicine

## 2019-05-30 ENCOUNTER — Encounter (HOSPITAL_BASED_OUTPATIENT_CLINIC_OR_DEPARTMENT_OTHER): Payer: Self-pay | Admitting: Emergency Medicine

## 2019-05-30 DIAGNOSIS — E119 Type 2 diabetes mellitus without complications: Secondary | ICD-10-CM | POA: Insufficient documentation

## 2019-05-30 DIAGNOSIS — F0781 Postconcussional syndrome: Secondary | ICD-10-CM | POA: Insufficient documentation

## 2019-05-30 DIAGNOSIS — Z79899 Other long term (current) drug therapy: Secondary | ICD-10-CM | POA: Insufficient documentation

## 2019-05-30 DIAGNOSIS — J45909 Unspecified asthma, uncomplicated: Secondary | ICD-10-CM | POA: Insufficient documentation

## 2019-05-30 DIAGNOSIS — I10 Essential (primary) hypertension: Secondary | ICD-10-CM | POA: Insufficient documentation

## 2019-05-30 DIAGNOSIS — W228XXD Striking against or struck by other objects, subsequent encounter: Secondary | ICD-10-CM | POA: Diagnosis not present

## 2019-05-30 DIAGNOSIS — R51 Headache: Secondary | ICD-10-CM | POA: Diagnosis present

## 2019-05-30 DIAGNOSIS — G44309 Post-traumatic headache, unspecified, not intractable: Secondary | ICD-10-CM | POA: Insufficient documentation

## 2019-05-30 HISTORY — DX: Concussion with loss of consciousness status unknown, initial encounter: S06.0XAA

## 2019-05-30 HISTORY — DX: Concussion with loss of consciousness of unspecified duration, initial encounter: S06.0X9A

## 2019-05-30 MED ORDER — CYCLOBENZAPRINE HCL 10 MG PO TABS: 10.0000 mg | ORAL_TABLET | Freq: Three times a day (TID) | ORAL | 0 refills | Status: AC | PRN

## 2019-05-30 MED ORDER — HYDROCODONE-ACETAMINOPHEN 5-325 MG PO TABS: 1.0000 | ORAL_TABLET | Freq: Four times a day (QID) | ORAL | 0 refills | Status: AC | PRN

## 2019-05-30 NOTE — ED Notes (Signed)
Patient verbalizes understanding of discharge instructions. Opportunity for questioning and answers were provided. Armband removed by staff, pt discharged from ED.  
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
## 2019-05-30 NOTE — Discharge Instructions (Signed)
Symptoms seem to be consistent with postconcussive syndrome.  Make an appointment to follow-up with neurology either here or when you get to [place].  Take the hydrocodone as needed for the pain.  Take Flexeril as needed.
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
